# Patient Record
Sex: Female | Born: 1935 | Race: White | Hispanic: No | Smoking: Never smoker
Health system: Southern US, Community
[De-identification: ages and names within clinical notes are randomized; demographics above are authoritative.]

## PROBLEM LIST (undated history)

## (undated) DIAGNOSIS — E785 Hyperlipidemia, unspecified: Secondary | ICD-10-CM

## (undated) DIAGNOSIS — H409 Unspecified glaucoma: Secondary | ICD-10-CM

## (undated) DIAGNOSIS — R1310 Dysphagia, unspecified: Secondary | ICD-10-CM

## (undated) DIAGNOSIS — M6281 Muscle weakness (generalized): Secondary | ICD-10-CM

## (undated) DIAGNOSIS — G40909 Epilepsy, unspecified, not intractable, without status epilepticus: Secondary | ICD-10-CM

## (undated) DIAGNOSIS — I2510 Atherosclerotic heart disease of native coronary artery without angina pectoris: Secondary | ICD-10-CM

## (undated) DIAGNOSIS — F0390 Unspecified dementia without behavioral disturbance: Secondary | ICD-10-CM

## (undated) DIAGNOSIS — F419 Anxiety disorder, unspecified: Secondary | ICD-10-CM

## (undated) DIAGNOSIS — K5792 Diverticulitis of intestine, part unspecified, without perforation or abscess without bleeding: Secondary | ICD-10-CM

## (undated) DIAGNOSIS — I10 Essential (primary) hypertension: Secondary | ICD-10-CM

## (undated) DIAGNOSIS — E039 Hypothyroidism, unspecified: Secondary | ICD-10-CM

## (undated) DIAGNOSIS — I509 Heart failure, unspecified: Secondary | ICD-10-CM

## (undated) HISTORY — PX: ABDOMINAL HYSTERECTOMY: SHX81

---

## 2004-08-25 ENCOUNTER — Emergency Department: Payer: Self-pay | Admitting: Emergency Medicine

## 2005-01-28 ENCOUNTER — Ambulatory Visit: Payer: Self-pay | Admitting: Internal Medicine

## 2006-01-11 ENCOUNTER — Ambulatory Visit: Payer: Self-pay | Admitting: Internal Medicine

## 2006-01-31 ENCOUNTER — Ambulatory Visit: Payer: Self-pay | Admitting: Internal Medicine

## 2006-05-15 ENCOUNTER — Other Ambulatory Visit: Payer: Self-pay

## 2006-05-15 ENCOUNTER — Emergency Department: Payer: Self-pay | Admitting: Emergency Medicine

## 2006-06-13 IMAGING — CT CT HEAD WITHOUT AND WITH CONTRAST
1 of 2 series · 13 of 30 positions shown, 17 images · non-contrast
Comparison: none

REASON FOR EXAM: ataxia TIA
COMMENTS:

[Series 2: without · axial · non-contrast · 0.41mm/px · z∈[+1212,+1342]mm · 13 of 32 slices shown, 17 images]
[im 3/32  brain]
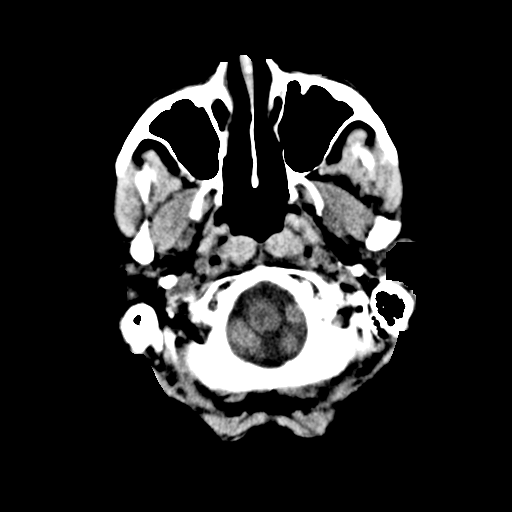
[im 3/32  bone]
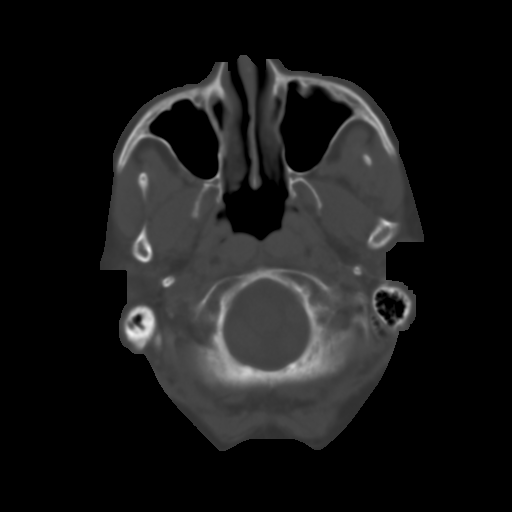
[im 5/32  brain]
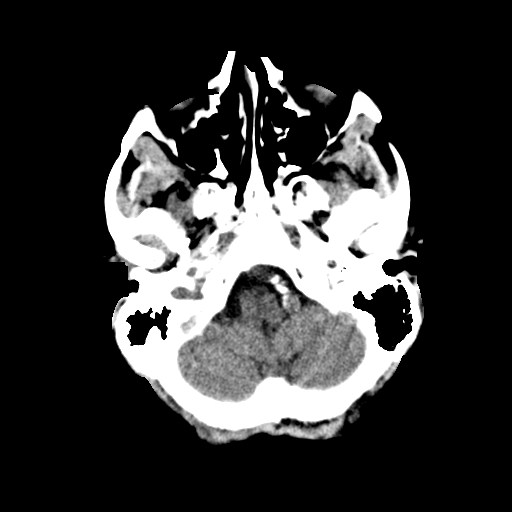
[im 7/32  brain]
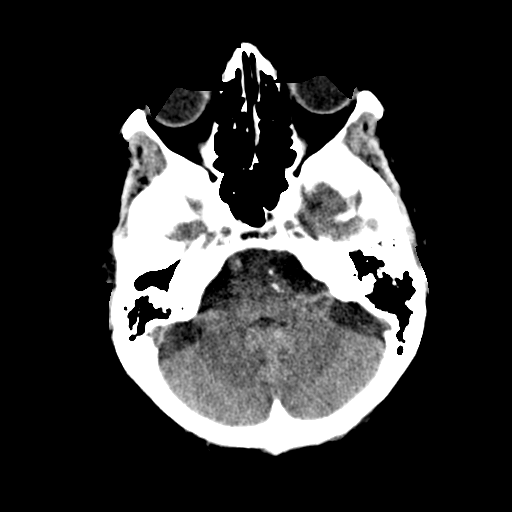
[im 9/32  brain]
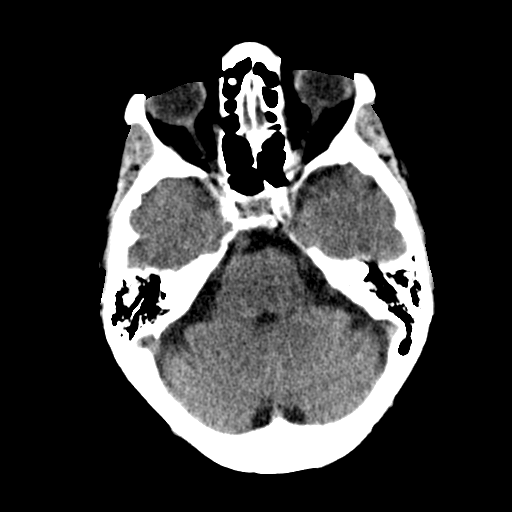
[im 12/32  brain]
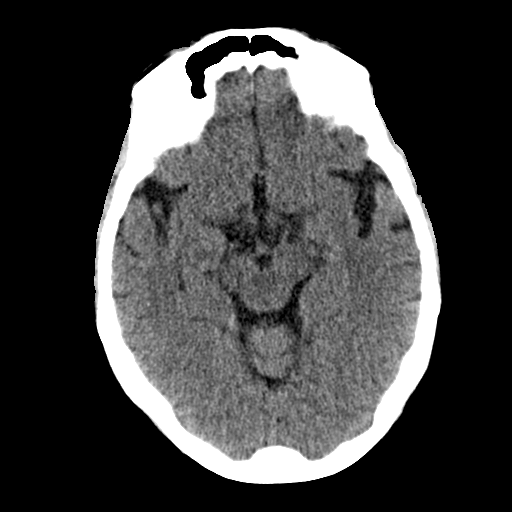
[im 12/32  bone]
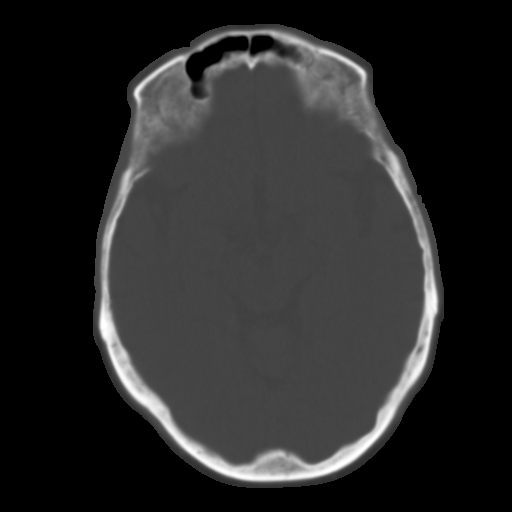
[im 14/32  brain]
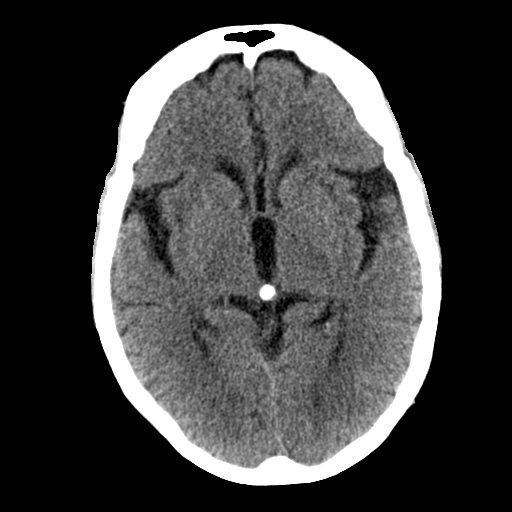
[im 16/32  brain]
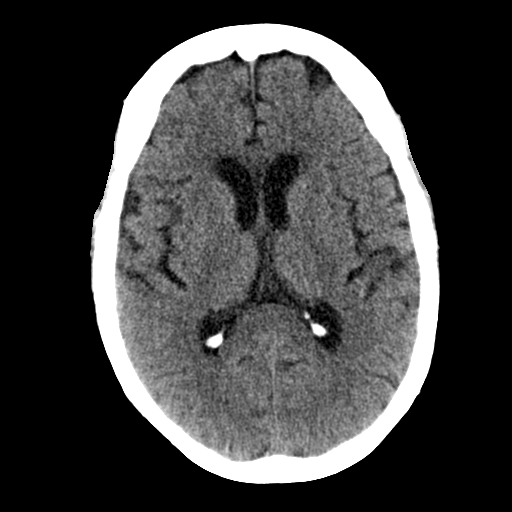
[im 18/32  brain]
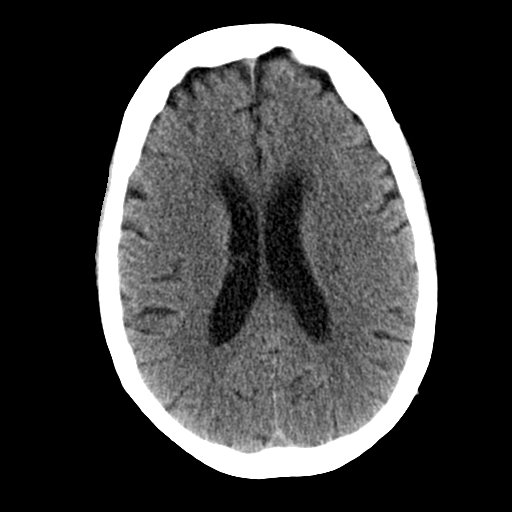
[im 20/32  brain]
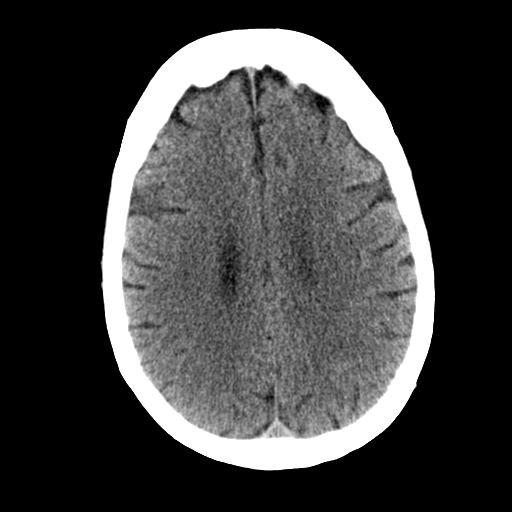
[im 20/32  bone]
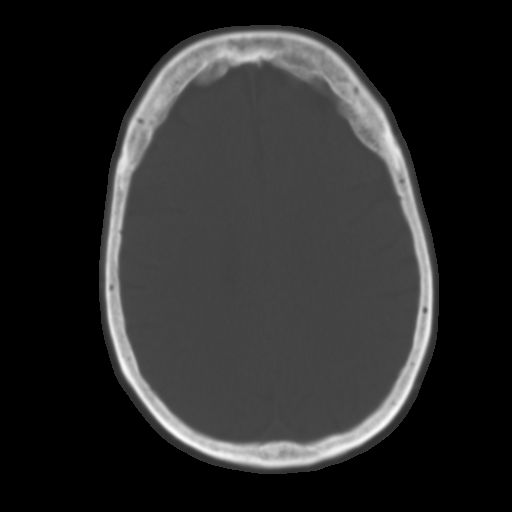
[im 23/32  brain]
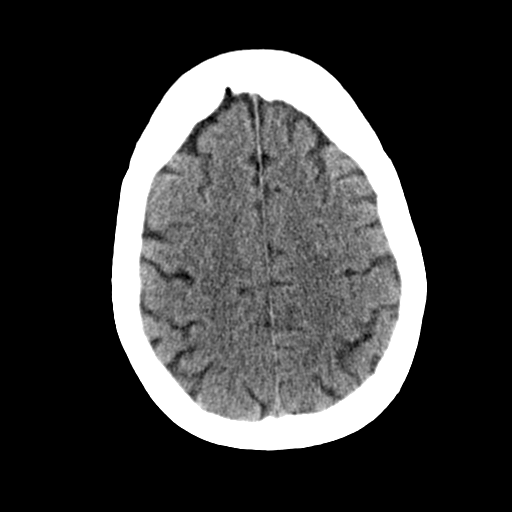
[im 25/32  brain]
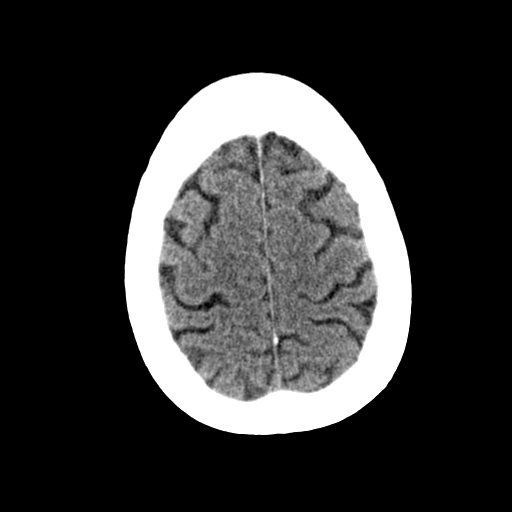
[im 27/32  brain]
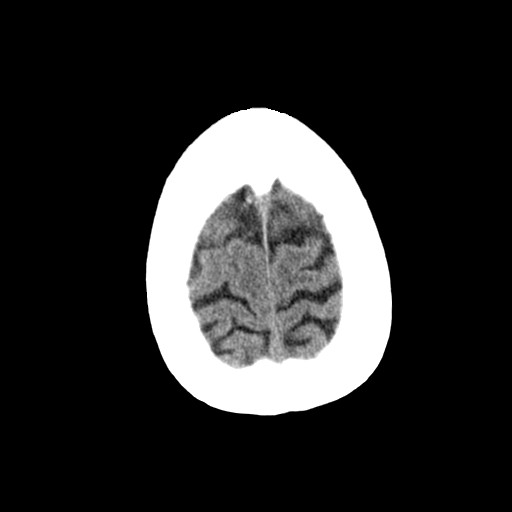
[im 29/32  brain]
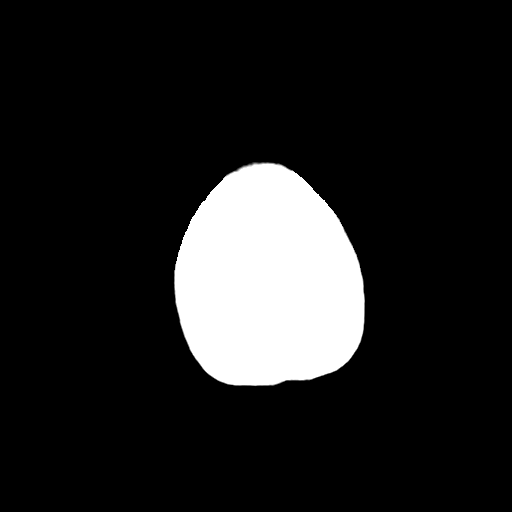
[im 29/32  bone]
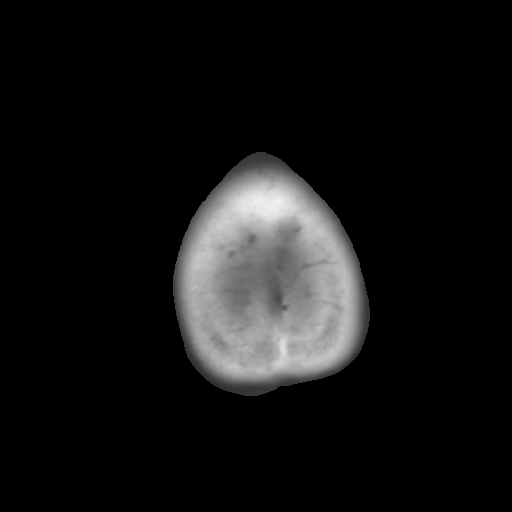

[13 of 30 positions shown; findings below may reference images not displayed]

PROCEDURE:     CT  - CT HEAD W/WO  - January 11, 2006  [DATE]

RESULT:     The patient is experiencing TIA symptoms.

The non-contrast images reveal mild age appropriate diffuse cerebral and
cerebellar atrophy. This is unchanged from a study 25 August, 2004.
There is no evidence of an intracranial hemorrhage or mass or hydrocephalus.
 Following intravenous administration of 40 ml of Isovue 370 the enhancement
pattern of the brain parenchyma is normal. The visualized portions of the
intracranial vasculature also exhibit no acute abnormality.  No enhancing
masses are seen.  At bone window settings I see no lytic or blastic lesions.
 There is no evidence of a skull fracture.  There is hyperostosis frontalis
interna.  The observed portions of the paranasal sinuses exhibit no acute
abnormality.
IMPRESSION: 1)I see no acute intracranial abnormality.  Specific attention to the
cerebellum and brainstem exhibit no acute findings.  Should the patient's
symptoms persist and remain unexplained, further evaluation with MRI may be
of value.

## 2006-07-13 ENCOUNTER — Ambulatory Visit: Payer: Self-pay | Admitting: Internal Medicine

## 2006-07-13 ENCOUNTER — Emergency Department: Payer: Self-pay | Admitting: Emergency Medicine

## 2006-07-14 ENCOUNTER — Ambulatory Visit: Payer: Self-pay | Admitting: Orthopaedic Surgery

## 2006-07-16 ENCOUNTER — Emergency Department: Payer: Self-pay | Admitting: Emergency Medicine

## 2006-07-18 ENCOUNTER — Ambulatory Visit: Payer: Self-pay | Admitting: Orthopaedic Surgery

## 2006-08-22 ENCOUNTER — Ambulatory Visit: Payer: Self-pay | Admitting: Internal Medicine

## 2007-01-23 IMAGING — NM NM THYROID IMAGING W/ UPTAKE SINGLE (24 HR)
1 series · 1 of 1 positions shown · non-contrast
Comparison: none

REASON FOR EXAM: Thyroid nodule
COMMENTS:

[Series 1: (id) thyroid scan · 2.40mm/px · 1 of 1 slices shown]
[im 1/1  full-range]
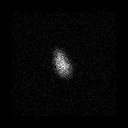

[1 of 1 positions shown; findings below may reference images not displayed]

PROCEDURE:     NM  - NM THYROID V-PDM 24 HR [DATE]  [DATE]

RESULT:       Following intravenous administration of 133.5 mCi radioactive
Iodine V-PDM, the 5-hour uptake measured 3.5% and the 24-hour uptake
measured 8.1%.  These values are in the hypothyroid range.

Thyroid scan was performed in the anterior view.  The LEFT lobe is not
visualized and apparently has been surgically resected.  There is noted
tracer activity on the RIGHT which apparently corresponds to the
hypovascular nodule noted on prior CT of 07/13/06.  This nodule, therefore,
appears to represent functioning thyroid tissue.  Within the functioning
area, no cold regions are identified.
IMPRESSION: 1.     Thyroid uptake values are in the hypothyroid range.
2.     Functioning thyroid tissue is observed on the RIGHT with no cold
areas seen.  The tracer activity on the RIGHT apparently corresponds to the
hypoechoic nodular appearing region on prior ultrasound and which is shown
by this exam to represent functioning thyroid tissue.

## 2007-04-28 ENCOUNTER — Ambulatory Visit: Payer: Self-pay | Admitting: Internal Medicine

## 2007-08-17 HISTORY — PX: OTHER SURGICAL HISTORY: SHX169

## 2007-09-15 ENCOUNTER — Ambulatory Visit: Payer: Self-pay | Admitting: Internal Medicine

## 2007-10-23 ENCOUNTER — Ambulatory Visit: Payer: Self-pay

## 2007-10-27 ENCOUNTER — Ambulatory Visit: Payer: Self-pay

## 2008-02-19 ENCOUNTER — Ambulatory Visit: Payer: Self-pay | Admitting: Orthopedic Surgery

## 2008-02-23 ENCOUNTER — Ambulatory Visit: Payer: Self-pay | Admitting: Orthopedic Surgery

## 2008-03-01 ENCOUNTER — Inpatient Hospital Stay: Payer: Self-pay | Admitting: Orthopedic Surgery

## 2008-03-12 ENCOUNTER — Encounter: Payer: Self-pay | Admitting: Internal Medicine

## 2008-03-16 ENCOUNTER — Encounter: Payer: Self-pay | Admitting: Internal Medicine

## 2008-05-20 ENCOUNTER — Ambulatory Visit: Payer: Self-pay | Admitting: Internal Medicine

## 2008-05-22 ENCOUNTER — Ambulatory Visit: Payer: Self-pay | Admitting: Internal Medicine

## 2008-11-12 ENCOUNTER — Ambulatory Visit: Payer: Self-pay | Admitting: Orthopedic Surgery

## 2008-11-26 ENCOUNTER — Ambulatory Visit: Payer: Self-pay | Admitting: Orthopedic Surgery

## 2009-03-06 ENCOUNTER — Emergency Department: Payer: Self-pay | Admitting: Emergency Medicine

## 2010-01-08 ENCOUNTER — Ambulatory Visit: Payer: Self-pay | Admitting: Internal Medicine

## 2010-05-12 ENCOUNTER — Inpatient Hospital Stay: Payer: Self-pay | Admitting: Internal Medicine

## 2010-05-15 ENCOUNTER — Encounter: Payer: Self-pay | Admitting: Internal Medicine

## 2010-05-16 ENCOUNTER — Encounter: Payer: Self-pay | Admitting: Internal Medicine

## 2010-06-16 ENCOUNTER — Encounter: Payer: Self-pay | Admitting: Internal Medicine

## 2010-08-25 ENCOUNTER — Other Ambulatory Visit: Payer: Self-pay | Admitting: Ophthalmology

## 2010-12-16 ENCOUNTER — Emergency Department: Payer: Self-pay | Admitting: Emergency Medicine

## 2011-04-13 ENCOUNTER — Inpatient Hospital Stay: Payer: Self-pay | Admitting: Surgery

## 2011-04-15 ENCOUNTER — Encounter: Payer: Self-pay | Admitting: Internal Medicine

## 2011-04-17 ENCOUNTER — Encounter: Payer: Self-pay | Admitting: Internal Medicine

## 2011-05-17 ENCOUNTER — Encounter: Payer: Self-pay | Admitting: Internal Medicine

## 2011-11-02 ENCOUNTER — Emergency Department: Payer: Self-pay | Admitting: Internal Medicine

## 2011-11-02 LAB — CBC
HGB: 13.9 g/dL (ref 12.0–16.0)
MCH: 30.8 pg (ref 26.0–34.0)
MCHC: 32.5 g/dL (ref 32.0–36.0)
MCV: 95 fL (ref 80–100)
Platelet: 226 10*3/uL (ref 150–440)
RBC: 4.5 10*6/uL (ref 3.80–5.20)

## 2011-11-02 LAB — COMPREHENSIVE METABOLIC PANEL
Albumin: 4.2 g/dL (ref 3.4–5.0)
Anion Gap: 6 — ABNORMAL LOW (ref 7–16)
BUN: 13 mg/dL (ref 7–18)
Calcium, Total: 9.3 mg/dL (ref 8.5–10.1)
Co2: 27 mmol/L (ref 21–32)
Creatinine: 0.8 mg/dL (ref 0.60–1.30)
Glucose: 112 mg/dL — ABNORMAL HIGH (ref 65–99)
Osmolality: 275 (ref 275–301)
Potassium: 4 mmol/L (ref 3.5–5.1)
SGOT(AST): 32 U/L (ref 15–37)
Sodium: 137 mmol/L (ref 136–145)
Total Protein: 8.3 g/dL — ABNORMAL HIGH (ref 6.4–8.2)

## 2011-11-02 LAB — TROPONIN I: Troponin-I: <0.02 ng/mL

## 2011-11-02 LAB — CK TOTAL AND CKMB (NOT AT ARMC): CK-MB: 3 ng/mL (ref 0.5–3.6)

## 2012-05-27 ENCOUNTER — Emergency Department: Payer: Self-pay | Admitting: Emergency Medicine

## 2012-05-27 LAB — COMPREHENSIVE METABOLIC PANEL
Albumin: 3.6 g/dL (ref 3.4–5.0)
Anion Gap: 9 (ref 7–16)
BUN: 17 mg/dL (ref 7–18)
Bilirubin,Total: 0.8 mg/dL (ref 0.2–1.0)
Calcium, Total: 9.1 mg/dL (ref 8.5–10.1)
Chloride: 102 mmol/L (ref 98–107)
EGFR (African American): 52 — ABNORMAL LOW
Glucose: 147 mg/dL — ABNORMAL HIGH (ref 65–99)
Potassium: 3.9 mmol/L (ref 3.5–5.1)
SGOT(AST): 15 U/L (ref 15–37)
SGPT (ALT): 16 U/L (ref 12–78)
Total Protein: 8 g/dL (ref 6.4–8.2)

## 2012-05-27 LAB — CBC WITH DIFFERENTIAL/PLATELET
Basophil #: 0 10*3/uL (ref 0.0–0.1)
Basophil %: 0.2 %
Eosinophil %: 0.6 %
HCT: 37.8 % (ref 35.0–47.0)
HGB: 13.1 g/dL (ref 12.0–16.0)
Lymphocyte #: 0.9 10*3/uL — ABNORMAL LOW (ref 1.0–3.6)
MCH: 32.2 pg (ref 26.0–34.0)
MCHC: 34.7 g/dL (ref 32.0–36.0)
MCV: 93 fL (ref 80–100)
Monocyte #: 0.9 x10 3/mm (ref 0.2–0.9)
Neutrophil %: 79.5 %
RBC: 4.06 10*6/uL (ref 3.80–5.20)
RDW: 13.8 % (ref 11.5–14.5)

## 2012-05-27 LAB — CK TOTAL AND CKMB (NOT AT ARMC): CK, Total: 54 U/L (ref 21–215)

## 2012-05-27 LAB — URINALYSIS, COMPLETE
Bilirubin,UR: NEGATIVE
Blood: NEGATIVE
Glucose,UR: NEGATIVE mg/dL (ref 0–75)
Hyaline Cast: 3
Leukocyte Esterase: NEGATIVE
Ph: 6 (ref 4.5–8.0)
Protein: NEGATIVE
Specific Gravity: 1.014 (ref 1.003–1.030)
Squamous Epithelial: <1

## 2012-05-27 LAB — TROPONIN I: Troponin-I: <0.02 ng/mL

## 2012-07-14 ENCOUNTER — Emergency Department: Payer: Self-pay | Admitting: Emergency Medicine

## 2012-12-09 ENCOUNTER — Emergency Department: Payer: Self-pay | Admitting: Emergency Medicine

## 2012-12-09 LAB — COMPREHENSIVE METABOLIC PANEL
Calcium, Total: 9.1 mg/dL (ref 8.5–10.1)
Chloride: 101 mmol/L (ref 98–107)
Co2: 28 mmol/L (ref 21–32)
Creatinine: 0.78 mg/dL (ref 0.60–1.30)
EGFR (Non-African Amer.): >60
SGPT (ALT): 22 U/L (ref 12–78)
Sodium: 135 mmol/L — ABNORMAL LOW (ref 136–145)
Total Protein: 8.9 g/dL — ABNORMAL HIGH (ref 6.4–8.2)

## 2012-12-09 LAB — CBC
HCT: 44.1 % (ref 35.0–47.0)
HGB: 14.9 g/dL (ref 12.0–16.0)
MCH: 31 pg (ref 26.0–34.0)
MCV: 92 fL (ref 80–100)
Platelet: 227 10*3/uL (ref 150–440)
WBC: 7.7 10*3/uL (ref 3.6–11.0)

## 2012-12-09 LAB — TROPONIN I: Troponin-I: <0.02 ng/mL

## 2012-12-10 LAB — URINALYSIS, COMPLETE
Bacteria: NONE SEEN
Bilirubin,UR: NEGATIVE
Leukocyte Esterase: NEGATIVE
Ph: 8 (ref 4.5–8.0)
Specific Gravity: 1.006 (ref 1.003–1.030)
Squamous Epithelial: NONE SEEN
WBC UR: <1 /HPF (ref 0–5)

## 2012-12-10 LAB — LIPASE, BLOOD: Lipase: 106 U/L (ref 73–393)

## 2013-01-06 LAB — COMPREHENSIVE METABOLIC PANEL
Albumin: 4 g/dL (ref 3.4–5.0)
Alkaline Phosphatase: 111 U/L (ref 50–136)
Anion Gap: 10 (ref 7–16)
BUN: 21 mg/dL — ABNORMAL HIGH (ref 7–18)
Bilirubin,Total: 1.2 mg/dL — ABNORMAL HIGH (ref 0.2–1.0)
Calcium, Total: 9.9 mg/dL (ref 8.5–10.1)
Chloride: 102 mmol/L (ref 98–107)
Co2: 23 mmol/L (ref 21–32)
EGFR (Non-African Amer.): 41 — ABNORMAL LOW
Glucose: 172 mg/dL — ABNORMAL HIGH (ref 65–99)
Osmolality: 277 (ref 275–301)
Potassium: 3.7 mmol/L (ref 3.5–5.1)
SGPT (ALT): 24 U/L (ref 12–78)
Sodium: 135 mmol/L — ABNORMAL LOW (ref 136–145)

## 2013-01-06 LAB — CBC
HGB: 15.7 g/dL (ref 12.0–16.0)
MCH: 31.1 pg (ref 26.0–34.0)
MCV: 90 fL (ref 80–100)
Platelet: 290 10*3/uL (ref 150–440)
RBC: 5.05 10*6/uL (ref 3.80–5.20)

## 2013-01-06 LAB — URINALYSIS, COMPLETE
Glucose,UR: NEGATIVE mg/dL (ref 0–75)
Granular Cast: 39
Hyaline Cast: 36
Ketone: NEGATIVE
Leukocyte Esterase: NEGATIVE
Nitrite: NEGATIVE
Ph: 5 (ref 4.5–8.0)
RBC,UR: 3 /HPF (ref 0–5)
Specific Gravity: 1.016 (ref 1.003–1.030)
Squamous Epithelial: 1
WBC UR: 1 /HPF (ref 0–5)

## 2013-01-06 LAB — TSH: Thyroid Stimulating Horm: 0.995 u[IU]/mL

## 2013-01-07 ENCOUNTER — Inpatient Hospital Stay: Payer: Self-pay | Admitting: Internal Medicine

## 2013-01-07 LAB — CBC WITH DIFFERENTIAL/PLATELET
Eosinophil %: 0 %
Lymphocyte #: 1.1 10*3/uL (ref 1.0–3.6)
Lymphocyte %: 4.2 %
MCV: 91 fL (ref 80–100)
Monocyte #: 1.8 x10 3/mm — ABNORMAL HIGH (ref 0.2–0.9)
Platelet: 306 10*3/uL (ref 150–440)
RBC: 4.95 10*6/uL (ref 3.80–5.20)
RDW: 14.1 % (ref 11.5–14.5)

## 2013-01-07 LAB — COMPREHENSIVE METABOLIC PANEL
Alkaline Phosphatase: 89 U/L (ref 50–136)
Bilirubin,Total: 1 mg/dL (ref 0.2–1.0)
Calcium, Total: 9.2 mg/dL (ref 8.5–10.1)
Co2: 24 mmol/L (ref 21–32)
Creatinine: 0.97 mg/dL (ref 0.60–1.30)
EGFR (African American): 59 — ABNORMAL LOW
Glucose: 140 mg/dL — ABNORMAL HIGH (ref 65–99)
Osmolality: 278 (ref 275–301)
Potassium: 4.4 mmol/L (ref 3.5–5.1)
SGOT(AST): 27 U/L (ref 15–37)
SGPT (ALT): 19 U/L (ref 12–78)
Sodium: 136 mmol/L (ref 136–145)
Total Protein: 7.5 g/dL (ref 6.4–8.2)

## 2013-01-08 LAB — CBC WITH DIFFERENTIAL/PLATELET
Basophil #: 0 10*3/uL (ref 0.0–0.1)
Basophil %: 0.2 %
Eosinophil %: 0.2 %
HCT: 39.7 % (ref 35.0–47.0)
Lymphocyte #: 1.6 10*3/uL (ref 1.0–3.6)
Lymphocyte %: 9.1 %
Monocyte #: 1.7 x10 3/mm — ABNORMAL HIGH (ref 0.2–0.9)
Monocyte %: 9.5 %
Neutrophil #: 14.2 10*3/uL — ABNORMAL HIGH (ref 1.4–6.5)
Neutrophil %: 81 %
Platelet: 241 10*3/uL (ref 150–440)
RBC: 4.38 10*6/uL (ref 3.80–5.20)
WBC: 17.6 10*3/uL — ABNORMAL HIGH (ref 3.6–11.0)

## 2013-01-08 LAB — URINE CULTURE

## 2013-01-08 LAB — BASIC METABOLIC PANEL
BUN: 15 mg/dL (ref 7–18)
Calcium, Total: 8.7 mg/dL (ref 8.5–10.1)
Co2: 26 mmol/L (ref 21–32)
Creatinine: 0.81 mg/dL (ref 0.60–1.30)
EGFR (African American): >60
Glucose: 104 mg/dL — ABNORMAL HIGH (ref 65–99)
Potassium: 3.9 mmol/L (ref 3.5–5.1)
Sodium: 136 mmol/L (ref 136–145)

## 2013-01-08 LAB — TSH: Thyroid Stimulating Horm: 0.968 u[IU]/mL

## 2013-01-09 LAB — CBC WITH DIFFERENTIAL/PLATELET
Basophil %: 0.4 %
Eosinophil %: 1 %
HCT: 35.2 % (ref 35.0–47.0)
MCHC: 34.4 g/dL (ref 32.0–36.0)
MCV: 90 fL (ref 80–100)
Monocyte #: 0.9 x10 3/mm (ref 0.2–0.9)
Monocyte %: 7.5 %
Neutrophil #: 9.6 10*3/uL — ABNORMAL HIGH (ref 1.4–6.5)
RBC: 3.91 10*6/uL (ref 3.80–5.20)
RDW: 14.6 % — ABNORMAL HIGH (ref 11.5–14.5)
WBC: 12.3 10*3/uL — ABNORMAL HIGH (ref 3.6–11.0)

## 2013-01-09 LAB — BASIC METABOLIC PANEL
Anion Gap: 7 (ref 7–16)
Calcium, Total: 8.2 mg/dL — ABNORMAL LOW (ref 8.5–10.1)
Chloride: 110 mmol/L — ABNORMAL HIGH (ref 98–107)
Co2: 22 mmol/L (ref 21–32)
EGFR (African American): >60
EGFR (Non-African Amer.): 59 — ABNORMAL LOW
Glucose: 97 mg/dL (ref 65–99)
Osmolality: 277 (ref 275–301)

## 2013-01-10 LAB — CBC WITH DIFFERENTIAL/PLATELET
Eosinophil #: 0.2 10*3/uL (ref 0.0–0.7)
Eosinophil %: 2.5 %
HCT: 34.4 % — ABNORMAL LOW (ref 35.0–47.0)
HGB: 12.1 g/dL (ref 12.0–16.0)
Lymphocyte #: 1.6 10*3/uL (ref 1.0–3.6)
Lymphocyte %: 17.5 %
MCH: 32 pg (ref 26.0–34.0)
MCHC: 35.2 g/dL (ref 32.0–36.0)
MCV: 91 fL (ref 80–100)
Monocyte %: 8.9 %
Neutrophil #: 6.3 10*3/uL (ref 1.4–6.5)
Platelet: 280 10*3/uL (ref 150–440)
RBC: 3.79 10*6/uL — ABNORMAL LOW (ref 3.80–5.20)
RDW: 14.7 % — ABNORMAL HIGH (ref 11.5–14.5)

## 2013-01-10 LAB — BASIC METABOLIC PANEL
Anion Gap: 6 — ABNORMAL LOW (ref 7–16)
BUN: 7 mg/dL (ref 7–18)
Calcium, Total: 8.2 mg/dL — ABNORMAL LOW (ref 8.5–10.1)
Co2: 22 mmol/L (ref 21–32)
Creatinine: 0.75 mg/dL (ref 0.60–1.30)
EGFR (African American): >60
Glucose: 88 mg/dL (ref 65–99)
Osmolality: 279 (ref 275–301)
Potassium: 4 mmol/L (ref 3.5–5.1)

## 2013-01-11 ENCOUNTER — Encounter: Payer: Self-pay | Admitting: Internal Medicine

## 2013-01-12 LAB — CULTURE, BLOOD (SINGLE)

## 2013-01-17 ENCOUNTER — Ambulatory Visit: Payer: Self-pay | Admitting: Internal Medicine

## 2013-01-18 ENCOUNTER — Encounter: Payer: Self-pay | Admitting: Internal Medicine

## 2013-02-06 LAB — URINALYSIS, COMPLETE
Bilirubin,UR: NEGATIVE
Hyaline Cast: 4
Nitrite: NEGATIVE
Ph: 6 (ref 4.5–8.0)
Protein: NEGATIVE
Squamous Epithelial: <1
WBC UR: 139 /HPF (ref 0–5)

## 2013-02-08 LAB — URINE CULTURE

## 2013-02-13 ENCOUNTER — Encounter: Payer: Self-pay | Admitting: Internal Medicine

## 2013-08-08 ENCOUNTER — Inpatient Hospital Stay (HOSPITAL_COMMUNITY)
Admission: EM | Admit: 2013-08-08 | Discharge: 2013-08-13 | DRG: 536 | Disposition: A | Discharge status: Skilled Nursing Facility | Payer: Medicare Other | Attending: Internal Medicine | Admitting: Internal Medicine

## 2013-08-08 ENCOUNTER — Encounter (HOSPITAL_COMMUNITY): Payer: Self-pay | Admitting: Emergency Medicine

## 2013-08-08 DIAGNOSIS — F0390 Unspecified dementia without behavioral disturbance: Secondary | ICD-10-CM | POA: Diagnosis present

## 2013-08-08 DIAGNOSIS — I10 Essential (primary) hypertension: Secondary | ICD-10-CM

## 2013-08-08 DIAGNOSIS — E039 Hypothyroidism, unspecified: Secondary | ICD-10-CM | POA: Diagnosis present

## 2013-08-08 DIAGNOSIS — W010XXA Fall on same level from slipping, tripping and stumbling without subsequent striking against object, initial encounter: Secondary | ICD-10-CM | POA: Diagnosis present

## 2013-08-08 DIAGNOSIS — Y9210 Unspecified residential institution as the place of occurrence of the external cause: Secondary | ICD-10-CM | POA: Diagnosis present

## 2013-08-08 DIAGNOSIS — S72009A Fracture of unspecified part of neck of unspecified femur, initial encounter for closed fracture: Principal | ICD-10-CM

## 2013-08-08 DIAGNOSIS — Z23 Encounter for immunization: Secondary | ICD-10-CM

## 2013-08-08 DIAGNOSIS — Z96649 Presence of unspecified artificial hip joint: Secondary | ICD-10-CM

## 2013-08-08 DIAGNOSIS — H409 Unspecified glaucoma: Secondary | ICD-10-CM | POA: Diagnosis present

## 2013-08-08 DIAGNOSIS — E871 Hypo-osmolality and hyponatremia: Secondary | ICD-10-CM | POA: Diagnosis present

## 2013-08-08 DIAGNOSIS — F411 Generalized anxiety disorder: Secondary | ICD-10-CM | POA: Diagnosis present

## 2013-08-08 DIAGNOSIS — G40909 Epilepsy, unspecified, not intractable, without status epilepticus: Secondary | ICD-10-CM | POA: Diagnosis present

## 2013-08-08 DIAGNOSIS — Z79899 Other long term (current) drug therapy: Secondary | ICD-10-CM

## 2013-08-08 DIAGNOSIS — Z8719 Personal history of other diseases of the digestive system: Secondary | ICD-10-CM

## 2013-08-08 DIAGNOSIS — Z880 Allergy status to penicillin: Secondary | ICD-10-CM

## 2013-08-08 DIAGNOSIS — E785 Hyperlipidemia, unspecified: Secondary | ICD-10-CM | POA: Diagnosis present

## 2013-08-08 DIAGNOSIS — S72001A Fracture of unspecified part of neck of right femur, initial encounter for closed fracture: Secondary | ICD-10-CM

## 2013-08-08 DIAGNOSIS — E876 Hypokalemia: Secondary | ICD-10-CM | POA: Diagnosis present

## 2013-08-08 HISTORY — DX: Hypothyroidism, unspecified: E03.9

## 2013-08-08 HISTORY — DX: Unspecified dementia, unspecified severity, without behavioral disturbance, psychotic disturbance, mood disturbance, and anxiety: F03.90

## 2013-08-08 HISTORY — DX: Essential (primary) hypertension: I10

## 2013-08-08 HISTORY — DX: Unspecified glaucoma: H40.9

## 2013-08-08 HISTORY — DX: Muscle weakness (generalized): M62.81

## 2013-08-08 HISTORY — DX: Epilepsy, unspecified, not intractable, without status epilepticus: G40.909

## 2013-08-08 HISTORY — DX: Anxiety disorder, unspecified: F41.9

## 2013-08-08 HISTORY — DX: Hyperlipidemia, unspecified: E78.5

## 2013-08-08 HISTORY — DX: Dysphagia, unspecified: R13.10

## 2013-08-08 HISTORY — DX: Diverticulitis of intestine, part unspecified, without perforation or abscess without bleeding: K57.92

## 2013-08-08 NOTE — ED Notes (Signed)
Patient loss balance and fell on right side tonight. Right hip pain 7/10, no deformity, no leg shortening, pain on ROM, no bruising, no LOC. Pt alert, oriented. VSS. No blood thinner.

## 2013-08-08 NOTE — ED Notes (Signed)
Bed: ZO10 Expected date:  Expected time:  Means of arrival:  Comments: EMS/79 yo female with fall-pain

## 2013-08-09 ENCOUNTER — Encounter (HOSPITAL_COMMUNITY): Payer: Self-pay | Admitting: Emergency Medicine

## 2013-08-09 ENCOUNTER — Emergency Department (HOSPITAL_COMMUNITY): Payer: Medicare Other

## 2013-08-09 DIAGNOSIS — S72009A Fracture of unspecified part of neck of unspecified femur, initial encounter for closed fracture: Secondary | ICD-10-CM

## 2013-08-09 LAB — CBC WITH DIFFERENTIAL/PLATELET
Basophils Absolute: 0 10*3/uL (ref 0.0–0.1)
Basophils Relative: 0 % (ref 0–1)
Eosinophils Relative: 1 % (ref 0–5)
HCT: 40.8 % (ref 36.0–46.0)
Hemoglobin: 13.9 g/dL (ref 12.0–15.0)
Lymphocytes Relative: 11 % — ABNORMAL LOW (ref 12–46)
MCH: 30.9 pg (ref 26.0–34.0)
MCHC: 34.1 g/dL (ref 30.0–36.0)
MCV: 90.7 fL (ref 78.0–100.0)
Monocytes Absolute: 1 10*3/uL (ref 0.1–1.0)
Monocytes Relative: 8 % (ref 3–12)
RBC: 4.5 MIL/uL (ref 3.87–5.11)
RDW: 13.7 % (ref 11.5–15.5)

## 2013-08-09 LAB — BASIC METABOLIC PANEL
BUN: 14 mg/dL (ref 6–23)
Calcium: 9.3 mg/dL (ref 8.4–10.5)
Chloride: 99 mEq/L (ref 96–112)
Creatinine, Ser: 1 mg/dL (ref 0.50–1.10)
GFR calc Af Amer: 55 mL/min — ABNORMAL LOW (ref >90)
Glucose, Bld: 152 mg/dL — ABNORMAL HIGH (ref 70–99)

## 2013-08-09 LAB — PROTIME-INR: INR: 1.03 (ref 0.00–1.49)

## 2013-08-09 LAB — ABO/RH: ABO/RH(D): A POS

## 2013-08-09 MED ORDER — ASPIRIN EC 81 MG PO TBEC
81.0000 mg | DELAYED_RELEASE_TABLET | Freq: Every day | ORAL | Status: DC
  Administered 2013-08-09 – 2013-08-13 (×5): 81 mg via ORAL
  Filled 2013-08-09 (×5): qty 1

## 2013-08-09 MED ORDER — METOPROLOL TARTRATE 1 MG/ML IV SOLN
5.0000 mg | INTRAVENOUS | Status: DC | PRN
  Filled 2013-08-09: qty 5

## 2013-08-09 MED ORDER — AMITRIPTYLINE HCL 25 MG PO TABS
25.0000 mg | ORAL_TABLET | Freq: Every day | ORAL | Status: DC
  Administered 2013-08-09 – 2013-08-12 (×4): 25 mg via ORAL
  Filled 2013-08-09 (×5): qty 1

## 2013-08-09 MED ORDER — FUROSEMIDE 20 MG PO TABS
20.0000 mg | ORAL_TABLET | Freq: Every day | ORAL | Status: DC
  Administered 2013-08-09 – 2013-08-13 (×5): 20 mg via ORAL
  Filled 2013-08-09 (×5): qty 1

## 2013-08-09 MED ORDER — ONDANSETRON HCL 4 MG/2ML IJ SOLN: 4.0000 mg | Freq: Four times a day (QID) | INTRAMUSCULAR | Status: DC | PRN

## 2013-08-09 MED ORDER — DONEPEZIL HCL 10 MG PO TABS
10.0000 mg | ORAL_TABLET | Freq: Every day | ORAL | Status: DC
  Administered 2013-08-09 – 2013-08-12 (×4): 10 mg via ORAL
  Filled 2013-08-09 (×5): qty 1

## 2013-08-09 MED ORDER — SODIUM CHLORIDE 0.9 % IV SOLN
INTRAVENOUS | Status: AC
  Administered 2013-08-09: 50 mL/h via INTRAVENOUS

## 2013-08-09 MED ORDER — POTASSIUM CHLORIDE 20 MEQ/15ML (10%) PO LIQD
10.0000 meq | Freq: Every day | ORAL | Status: DC
  Administered 2013-08-09: 10 meq via ORAL
  Filled 2013-08-09 (×2): qty 7.5

## 2013-08-09 MED ORDER — SENNOSIDES-DOCUSATE SODIUM 8.6-50 MG PO TABS
1.0000 | ORAL_TABLET | Freq: Two times a day (BID) | ORAL | Status: DC
  Administered 2013-08-09 – 2013-08-13 (×8): 1 via ORAL
  Filled 2013-08-09 (×10): qty 1

## 2013-08-09 MED ORDER — AMLODIPINE BESYLATE 5 MG PO TABS
5.0000 mg | ORAL_TABLET | Freq: Every day | ORAL | Status: DC
  Administered 2013-08-09 – 2013-08-13 (×5): 5 mg via ORAL
  Filled 2013-08-09 (×5): qty 1

## 2013-08-09 MED ORDER — HEPARIN SODIUM (PORCINE) 5000 UNIT/ML IJ SOLN
5000.0000 [IU] | Freq: Three times a day (TID) | INTRAMUSCULAR | Status: DC
  Administered 2013-08-09: 5000 [IU] via SUBCUTANEOUS
  Filled 2013-08-09 (×4): qty 1

## 2013-08-09 MED ORDER — SODIUM CHLORIDE 0.9 % IV SOLN
INTRAVENOUS | Status: DC
  Administered 2013-08-09: 07:00:00 via INTRAVENOUS

## 2013-08-09 MED ORDER — PANTOPRAZOLE SODIUM 40 MG PO TBEC
40.0000 mg | DELAYED_RELEASE_TABLET | Freq: Every day | ORAL | Status: DC
  Administered 2013-08-09 – 2013-08-13 (×5): 40 mg via ORAL
  Filled 2013-08-09 (×4): qty 1

## 2013-08-09 MED ORDER — ALPRAZOLAM 1 MG PO TABS
1.0000 mg | ORAL_TABLET | Freq: Every day | ORAL | Status: DC
  Administered 2013-08-09 – 2013-08-12 (×4): 1 mg via ORAL
  Filled 2013-08-09 (×4): qty 1

## 2013-08-09 MED ORDER — LEVOTHYROXINE SODIUM 88 MCG PO TABS
88.0000 ug | ORAL_TABLET | Freq: Every day | ORAL | Status: DC
  Administered 2013-08-09 – 2013-08-13 (×4): 88 ug via ORAL
  Filled 2013-08-09 (×6): qty 1

## 2013-08-09 MED ORDER — FENTANYL CITRATE 0.05 MG/ML IJ SOLN
25.0000 ug | INTRAMUSCULAR | Status: DC | PRN
  Filled 2013-08-09: qty 2

## 2013-08-09 MED ORDER — TETANUS-DIPHTH-ACELL PERTUSSIS 5-2.5-18.5 LF-MCG/0.5 IM SUSP
0.5000 mL | Freq: Once | INTRAMUSCULAR | Status: DC
  Filled 2013-08-09 (×2): qty 0.5

## 2013-08-09 MED ORDER — METOPROLOL SUCCINATE ER 100 MG PO TB24
100.0000 mg | ORAL_TABLET | Freq: Every day | ORAL | Status: DC
  Administered 2013-08-09 – 2013-08-13 (×5): 100 mg via ORAL
  Filled 2013-08-09 (×5): qty 1

## 2013-08-09 MED ORDER — SIMVASTATIN 10 MG PO TABS
10.0000 mg | ORAL_TABLET | Freq: Every day | ORAL | Status: DC
  Administered 2013-08-09 – 2013-08-12 (×4): 10 mg via ORAL
  Filled 2013-08-09 (×5): qty 1

## 2013-08-09 MED ORDER — FENTANYL CITRATE 0.05 MG/ML IJ SOLN
50.0000 ug | Freq: Once | INTRAMUSCULAR | Status: AC
  Administered 2013-08-09: 50 ug via INTRAVENOUS
  Filled 2013-08-09: qty 2

## 2013-08-09 MED ORDER — LATANOPROST 0.005 % OP SOLN
1.0000 [drp] | Freq: Every day | OPHTHALMIC | Status: DC
  Administered 2013-08-09 – 2013-08-12 (×4): 1 [drp] via OPHTHALMIC
  Filled 2013-08-09: qty 2.5

## 2013-08-09 NOTE — H&P (Signed)
[facility] History and Physical  Gloria Martinez WUJ:[id number] DOB: 10-Oct-1919 DOA: 08/08/2013  Referring physician: EDP PCP: Lauro Regulus., MD   Chief Complaint: Fall   HPI: Gloria Martinez is a 79 y.o. female who presents to the ED after a fall.  Fall was initially reported to have occurred earlier today, but on my interview with the patient she states she thinks it was 2 days ago.  Patient apparently was trying to crush a bug on the ground when she lost her balance and fell.  She has had severe R hip pain with weight bearing ever since the fall and so she presented to the ED.  Review of Systems: Systems reviewed.  As above, otherwise negative  Past Medical History  Diagnosis Date  . Hypertension   . Hypothyroidism   . Dementia   . Dysphagia   . Epilepsy   . Diverticulitis   . Muscle weakness   . Hyperlipidemia   . Anxiety   . Glaucoma    Past Surgical History  Procedure Laterality Date  . Hip replacement  Right 2009   Social History:  reports that she has never smoked. She does not have any smokeless tobacco history on file. She reports that she does not drink alcohol or use illicit drugs.  Allergies  Allergen Reactions  . Ciprofloxacin   . Macrobid [employer] Macro]   . Penicillins   . Percocet [Oxycodone-Acetaminophen]   . Sulfa Antibiotics   . Tussionex Pennkinetic Er [Hydrocod Polst-Cpm Polst Er]   . Vicodin [Hydrocodone-Acetaminophen]     No family history on file.   Prior to Admission medications   Medication Sig Start Date End Date Taking? Authorizing Provider  ALPRAZolam Prudy Feeler) 0.5 MG tablet Take 1 mg by mouth at bedtime.    Yes Historical Provider, MD  amitriptyline (ELAVIL) 25 MG tablet Take 25 mg by mouth at bedtime.   Yes Historical Provider, MD  amLODipine (NORVASC) 5 MG tablet Take 5 mg by mouth daily.   Yes Historical Provider, MD  aspirin EC 81 MG tablet Take 81 mg by mouth daily.   Yes Historical Provider, MD  donepezil  (ARICEPT) 5 MG tablet Take 10 mg by mouth at bedtime.    Yes Historical Provider, MD  furosemide (LASIX) 20 MG tablet Take 20 mg by mouth daily.   Yes Historical Provider, MD  latanoprost (XALATAN) 0.005 % ophthalmic solution Place 1 drop into both eyes at bedtime.   Yes Historical Provider, MD  levothyroxine (SYNTHROID, LEVOTHROID) 88 MCG tablet Take 88 mcg by mouth daily before breakfast.   Yes Historical Provider, MD  lovastatin (MEVACOR) 20 MG tablet Take 40 mg by mouth at bedtime.   Yes Historical Provider, MD  metoprolol succinate (TOPROL-XL) 50 MG 24 hr tablet Take 100 mg by mouth daily. Take with or immediately following a meal.   Yes Historical Provider, MD  omeprazole (PRILOSEC) 20 MG capsule Take 20 mg by mouth daily.   Yes Historical Provider, MD  potassium chloride 20 MEQ/15ML (10%) solution Take 10 mEq by mouth daily.   Yes Historical Provider, MD  senna-docusate (SENOKOT-S) 8.6-50 MG per tablet Take 1 tablet by mouth 2 (two) times daily.   Yes Historical Provider, MD   Physical Exam: Filed Vitals:   08/09/13 0414  BP: 179/82  Pulse: 82  Temp: 98.1 F (36.7 C)  Resp: 21    BP 179/82  Pulse 82  Temp(Src) 98.1 F (36.7 C) (Oral)  Resp 21  SpO2 94%  General Appearance:  Alert, oriented, no distress, appears stated age  Head:    Normocephalic, atraumatic  Eyes:    PERRL, EOMI, sclera non-icteric        Nose:   Nares without drainage or epistaxis. Mucosa, turbinates normal  Throat:   Moist mucous membranes. Oropharynx without erythema or exudate.  Neck:   Supple. No carotid bruits.  No thyromegaly.  No lymphadenopathy.   Back:     No CVA tenderness, no spinal tenderness  Lungs:     Clear to auscultation bilaterally, without wheezes, rhonchi or rales  Chest wall:    No tenderness to palpitation  Heart:    Regular rate and rhythm without murmurs, gallops, rubs  Abdomen:     Soft, non-tender, nondistended, normal bowel sounds, no organomegaly  Genitalia:    deferred   Rectal:    deferred  Extremities:   No clubbing, cyanosis or edema.  Pulses:   2+ and symmetric all extremities  Skin:   Skin color, texture, turgor normal, no rashes or lesions  Lymph nodes:   Cervical, supraclavicular, and axillary nodes normal  Neurologic:   CNII-XII intact. Normal strength, sensation and reflexes      throughout    Labs on Admission:  Basic Metabolic Panel:  Recent Labs Lab 08/09/13 0345  NA 137  K 4.0  CL 99  CO2 24  GLUCOSE 152*  BUN 14  CREATININE 1.00  CALCIUM 9.3   Liver Function Tests: No results found for this basename: AST, ALT, ALKPHOS, BILITOT, PROT, ALBUMIN,  in the last 168 hours No results found for this basename: LIPASE, AMYLASE,  in the last 168 hours No results found for this basename: AMMONIA,  in the last 168 hours CBC:  Recent Labs Lab 08/09/13 0345  WBC 12.2*  NEUTROABS 9.8*  HGB 13.9  HCT 40.8  MCV 90.7  PLT 208   Cardiac Enzymes: No results found for this basename: CKTOTAL, CKMB, CKMBINDEX, TROPONINI,  in the last 168 hours  BNP (last 3 results) No results found for this basename: PROBNP,  in the last 8760 hours CBG: No results found for this basename: GLUCAP,  in the last 168 hours  Radiological Exams on Admission: Dg Hip Complete Right  08/09/2013   CLINICAL DATA:  Fall.  Right lateral hip pain.  EXAM: RIGHT HIP - COMPLETE 2+ VIEW  COMPARISON:  None.  FINDINGS: Right total hip arthroplasty is present. Femoral stem appears within normal limits. There is no fracture identified. Small amount of lucency around the superior margin of the right acetabular cup could represent early loosening. There is no periprosthetic fracture identified.  IMPRESSION: No acute osseous abnormality.   Electronically Signed   By: Andreas Newport M.D.   On: 08/09/2013 01:00   Ct Head Wo Contrast  08/09/2013   CLINICAL DATA:  Fall.  Dementia.  Head trauma.  Neck pain.  EXAM: CT HEAD WITHOUT CONTRAST  CT CERVICAL SPINE WITHOUT CONTRAST   TECHNIQUE: Multidetector CT imaging of the head and cervical spine was performed following the standard protocol without intravenous contrast. Multiplanar CT image reconstructions of the cervical spine were also generated.  COMPARISON:  None.  FINDINGS: CT HEAD FINDINGS  No mass lesion, mass effect, midline shift, hydrocephalus, hemorrhage. No acute territorial cortical ischemia/infarct. Atrophy and chronic ischemic white matter disease is present. Calvarium is intact. Intracranial atherosclerosis.  CT CERVICAL SPINE FINDINGS  There is a levoconvex torticollis. Motion artifact is present on the examination. Multilevel cervical spondylosis and facet arthrosis. Odontoid and occipital  condyles appear intact. In the posterior fossa, vertebral basilar dolichoectasia is partially visualized. Lung apices appear within normal limits. Osteopenia. There is no cervical spine fracture or dislocation. Anterolisthesis of C3 on C4 and C4 on C5 appears degenerative. This measures about 2-3 mm at both levels. Multilevel disc osteophyte complexes are present. Carotid atherosclerosis incidentally noted.  IMPRESSION: 1. Atrophy and chronic ischemic white matter disease without acute intracranial abnormality. 2. Cervical spondylosis and facet arthrosis without acute osseous abnormality.   Electronically Signed   By: Andreas Newport M.D.   On: 08/09/2013 01:10   Ct Cervical Spine Wo Contrast  08/09/2013   CLINICAL DATA:  Fall.  Dementia.  Head trauma.  Neck pain.  EXAM: CT HEAD WITHOUT CONTRAST  CT CERVICAL SPINE WITHOUT CONTRAST  TECHNIQUE: Multidetector CT imaging of the head and cervical spine was performed following the standard protocol without intravenous contrast. Multiplanar CT image reconstructions of the cervical spine were also generated.  COMPARISON:  None.  FINDINGS: CT HEAD FINDINGS  No mass lesion, mass effect, midline shift, hydrocephalus, hemorrhage. No acute territorial cortical ischemia/infarct. Atrophy and chronic  ischemic white matter disease is present. Calvarium is intact. Intracranial atherosclerosis.  CT CERVICAL SPINE FINDINGS  There is a levoconvex torticollis. Motion artifact is present on the examination. Multilevel cervical spondylosis and facet arthrosis. Odontoid and occipital condyles appear intact. In the posterior fossa, vertebral basilar dolichoectasia is partially visualized. Lung apices appear within normal limits. Osteopenia. There is no cervical spine fracture or dislocation. Anterolisthesis of C3 on C4 and C4 on C5 appears degenerative. This measures about 2-3 mm at both levels. Multilevel disc osteophyte complexes are present. Carotid atherosclerosis incidentally noted.  IMPRESSION: 1. Atrophy and chronic ischemic white matter disease without acute intracranial abnormality. 2. Cervical spondylosis and facet arthrosis without acute osseous abnormality.   Electronically Signed   By: Andreas Newport M.D.   On: 08/09/2013 01:10   Ct Hip Right Wo Contrast  08/09/2013   CLINICAL DATA:  Right hip pain.  EXAM: CT OF THE RIGHT HIP WITHOUT CONTRAST  TECHNIQUE: Multidetector CT imaging was performed according to the standard protocol. Multiplanar CT image reconstructions were also generated.  COMPARISON:  Right hip radiographs performed earlier today at 12:50 a.m.  FINDINGS: There appears to be an incomplete oblique fracture line extending across the lateral cortex of the proximal fibular diaphysis, extending likely to the prosthesis. The patient's right hip prosthesis is otherwise unremarkable in appearance. There is no evidence of loosening. The prosthesis is noted in expected alignment. The right superior and inferior pubic rami are unremarkable in appearance.  Relatively diffuse calcification is noted along the right common and superficial femoral artery. Mild diverticulosis is noted along the visualized sigmoid colon, without evidence for diverticulitis. The bladder is grossly unremarkable in appearance,  though not well assessed due to metal artifact. There is no evidence of significant soft tissue injury.  IMPRESSION: 1. Incomplete oblique fracture line noted extending across the lateral cortex of the proximal fibular diaphysis, likely extending to the prosthesis. Given the incomplete nature of the fracture line, there is no evidence of displacement. 2. Right hip prosthesis is otherwise unremarkable in appearance, without evidence of loosening. 3. Relatively diffuse calcification along the right common and superficial femoral artery. 4. Mild diverticulosis along the visualized sigmoid colon, without evidence of diverticulitis.   Electronically Signed   By: Roanna Raider M.D.   On: 08/09/2013 03:00    EKG: Independently reviewed.  Assessment/Plan Active Problems:   Hip fracture  1. R hip fracture - partial fracture of the R hip, non-displaced and appears to be held in place by the R hip prosthesis from a previous hip replacement surgery.  Dr Ophelia Charter to evaluate today, but initial orthopaedic surgery opinion sounds like this may be able to heal on its own without further surgical intervention.  Keeping patient NPO and bed rest just in case but suspect that she will likely be able to have diet advanced as soon as she is evaluated by ortho surgery.  Pain control with PRN fentanyl for now.    Code Status: Full Code  Family Communication: No family in room Disposition Plan: Admit to inpatient   Time spent: 50 min  GARDNER, JARED M. [facility] Pager [phone]  If 7AM-7PM, please contact the day team taking care of the patient [email or web address] Password [facility] 08/09/2013, 5:13 AM

## 2013-08-09 NOTE — Progress Notes (Signed)
Patient refusing to have peripheral IV placed. Dr.Singh notified. No new orders given

## 2013-08-09 NOTE — Progress Notes (Signed)
[facility]                                                                                Patient Demographics  Gloria Martinez, is a 79 y.o. female, DOB - 07-31-1920, ZOX:[id number]  Admit date - 08/08/2013   Admitting Physician Hillary Bow, DO  Outpatient Primary MD for the patient is Lauro Regulus., MD  LOS - 1   Chief Complaint  Patient presents with  . Fall        Assessment & Plan    Patient admitted few hours ago by my partner for a trip and fall at nursing home causing    1. Right hip pain and discomfort secondary to partial incomplete fibular diaphysis fracture. She is a previous hip prosthesis on the right side.- Orthopedic physician Dr. August Saucer saw the patient in the ER and requested that patient be seen this morning by his partner Dr. Ophelia Charter, Dr. Ophelia Charter to evaluate the patient is morning to decide if patient needs surgical intervention. I have discussed her care in detail with her power of attorney Dorinda Hill phone number [phone].   Cardio-Pulm Risk stratification for surgery and recommendations to minimize the same:-  A.Cardio-Pulmonary Risk -  this patient is a moderate to high for adverse Cardio-Pulmonary  Outcome  from surgery, the risks and benefits were discussed and acceptable to Dorinda Hill power of attorney. Dr. Ophelia Charter will discuss all surgical and nonsurgical options with the patient and family.  Recommendations for optimizing Cardio-Pulmonary  Risk risk factors  1. Keep SBP<140, HR<85, use Lopressor 5mg  IV q4hrs PRN, or B.Blocker drip PRN. 2. Moniotr I&Os. 3. Minimal sedation and Narcotics. 4. Good pulmunary toilet. 5. PRN Nebs and as needed oxygen to keep Pox>90% 6. Hb>8, transfuse as needed- Lasix 10mg  IV after each unit PRBC Transfused.   B.Bleeding Risk - no previous surgical complications, no easy bruising. She was on baby aspirin at home   Lab Results  Component Value Date   PLT 208 08/09/2013                  No results found for this  basename: INR, PROTIME    Baseline EKG shows sinus rhythm with no acute changes. No loud murmurs on exam. Denies any history of CAD CHF, no history of exertional angina or CHF either.   Will request Surgeon to please Order Lovenox/DVT prophylaxis of his/her choice when OK from Surgeon's standpoint post op. For now she is on heparin.      2. Hypertension. Continue Toprol-XL, will give medications with a sip of water. IV Lopressor ordered an as-needed basis.     3. Hypothyroidism. Continue Synthroid at home dose.     4. Dyslipidemia continue statin at home dose.    Code Status: Full for now  Family Communication: Stressed with power of attorney Dorinda Hill phone #[phone]  Disposition Plan: Rehabilitation/nursing home   Procedures CT scan of the hip   Consults  Ortho physicians Dr. August Saucer and Ophelia Charter   Medications  Scheduled Meds: . ALPRAZolam  1 mg Oral QHS  . amitriptyline  25 mg Oral QHS  . amLODipine  5 mg Oral Daily  . aspirin EC  81  mg Oral Daily  . donepezil  10 mg Oral QHS  . furosemide  20 mg Oral Daily  . heparin  5,000 Units Subcutaneous Q8H  . latanoprost  1 drop Both Eyes QHS  . levothyroxine  88 mcg Oral QAC breakfast  . metoprolol succinate  100 mg Oral Daily  . pantoprazole  40 mg Oral Daily  . potassium chloride  10 mEq Oral Daily  . senna-docusate  1 tablet Oral BID  . simvastatin  10 mg Oral q1800  . Tdap  0.5 mL Intramuscular Once   Continuous Infusions: . sodium chloride     PRN Meds:.fentaNYL, metoprolol  DVT Prophylaxis  heparin  Lab Results  Component Value Date   PLT 208 08/09/2013    Antibiotics    Anti-infectives   None          Subjective:   Vallarie Fei today has, No headache, No chest pain, No abdominal pain - No Nausea, No new weakness tingling or numbness, No Cough - SOB. Had right-sided hip discomfort  Objective:   Filed Vitals:   08/09/13 0145 08/09/13 0414 08/09/13 0546 08/09/13 0700  BP: 167/72 179/82  150/66   Pulse:  82 81   Temp:  98.1 F (36.7 C) 98.8 F (37.1 C)   TempSrc:  Oral Oral   Resp: 17 21 18    Height:   5\' 6"  (1.676 m)   Weight:   65.3 kg (143 lb 15.4 oz)   SpO2: 92% 94% 92% 94%    Wt Readings from Last 3 Encounters:  08/09/13 65.3 kg (143 lb 15.4 oz)    No intake or output data in the 24 hours ending 08/09/13 0921  Exam Awake , Oriented X 1, No new F.N deficits, Normal affect Hubbard.AT,PERRAL Supple Neck,No JVD, No cervical lymphadenopathy appriciated.  Symmetrical Chest wall movement, Good air movement bilaterally, CTAB RRR,No Gallops,Rubs or new Murmurs, No Parasternal Heave +ve B.Sounds, Abd Soft, Non tender, No organomegaly appriciated, No rebound - guarding or rigidity. No Cyanosis, Clubbing or edema, No new Rash or bruise, right leg appears to be internally rotated    Data Review   Micro Results No results found for this or any previous visit (from the past 240 hour(s)).  Radiology Reports Dg Hip Complete Right  08/09/2013   CLINICAL DATA:  Fall.  Right lateral hip pain.  EXAM: RIGHT HIP - COMPLETE 2+ VIEW  COMPARISON:  None.  FINDINGS: Right total hip arthroplasty is present. Femoral stem appears within normal limits. There is no fracture identified. Small amount of lucency around the superior margin of the right acetabular cup could represent early loosening. There is no periprosthetic fracture identified.  IMPRESSION: No acute osseous abnormality.   Electronically Signed   By: Andreas Newport M.D.   On: 08/09/2013 01:00   Ct Head Wo Contrast  08/09/2013   CLINICAL DATA:  Fall.  Dementia.  Head trauma.  Neck pain.  EXAM: CT HEAD WITHOUT CONTRAST  CT CERVICAL SPINE WITHOUT CONTRAST  TECHNIQUE: Multidetector CT imaging of the head and cervical spine was performed following the standard protocol without intravenous contrast. Multiplanar CT image reconstructions of the cervical spine were also generated.  COMPARISON:  None.  FINDINGS: CT HEAD FINDINGS  No mass  lesion, mass effect, midline shift, hydrocephalus, hemorrhage. No acute territorial cortical ischemia/infarct. Atrophy and chronic ischemic white matter disease is present. Calvarium is intact. Intracranial atherosclerosis.  CT CERVICAL SPINE FINDINGS  There is a levoconvex torticollis. Motion artifact is present on the  examination. Multilevel cervical spondylosis and facet arthrosis. Odontoid and occipital condyles appear intact. In the posterior fossa, vertebral basilar dolichoectasia is partially visualized. Lung apices appear within normal limits. Osteopenia. There is no cervical spine fracture or dislocation. Anterolisthesis of C3 on C4 and C4 on C5 appears degenerative. This measures about 2-3 mm at both levels. Multilevel disc osteophyte complexes are present. Carotid atherosclerosis incidentally noted.  IMPRESSION: 1. Atrophy and chronic ischemic white matter disease without acute intracranial abnormality. 2. Cervical spondylosis and facet arthrosis without acute osseous abnormality.   Electronically Signed   By: Andreas Newport M.D.   On: 08/09/2013 01:10   Ct Cervical Spine Wo Contrast  08/09/2013   CLINICAL DATA:  Fall.  Dementia.  Head trauma.  Neck pain.  EXAM: CT HEAD WITHOUT CONTRAST  CT CERVICAL SPINE WITHOUT CONTRAST  TECHNIQUE: Multidetector CT imaging of the head and cervical spine was performed following the standard protocol without intravenous contrast. Multiplanar CT image reconstructions of the cervical spine were also generated.  COMPARISON:  None.  FINDINGS: CT HEAD FINDINGS  No mass lesion, mass effect, midline shift, hydrocephalus, hemorrhage. No acute territorial cortical ischemia/infarct. Atrophy and chronic ischemic white matter disease is present. Calvarium is intact. Intracranial atherosclerosis.  CT CERVICAL SPINE FINDINGS  There is a levoconvex torticollis. Motion artifact is present on the examination. Multilevel cervical spondylosis and facet arthrosis. Odontoid and occipital  condyles appear intact. In the posterior fossa, vertebral basilar dolichoectasia is partially visualized. Lung apices appear within normal limits. Osteopenia. There is no cervical spine fracture or dislocation. Anterolisthesis of C3 on C4 and C4 on C5 appears degenerative. This measures about 2-3 mm at both levels. Multilevel disc osteophyte complexes are present. Carotid atherosclerosis incidentally noted.  IMPRESSION: 1. Atrophy and chronic ischemic white matter disease without acute intracranial abnormality. 2. Cervical spondylosis and facet arthrosis without acute osseous abnormality.   Electronically Signed   By: Andreas Newport M.D.   On: 08/09/2013 01:10   Ct Hip Right Wo Contrast  08/09/2013   CLINICAL DATA:  Right hip pain.  EXAM: CT OF THE RIGHT HIP WITHOUT CONTRAST  TECHNIQUE: Multidetector CT imaging was performed according to the standard protocol. Multiplanar CT image reconstructions were also generated.  COMPARISON:  Right hip radiographs performed earlier today at 12:50 a.m.  FINDINGS: There appears to be an incomplete oblique fracture line extending across the lateral cortex of the proximal fibular diaphysis, extending likely to the prosthesis. The patient's right hip prosthesis is otherwise unremarkable in appearance. There is no evidence of loosening. The prosthesis is noted in expected alignment. The right superior and inferior pubic rami are unremarkable in appearance.  Relatively diffuse calcification is noted along the right common and superficial femoral artery. Mild diverticulosis is noted along the visualized sigmoid colon, without evidence for diverticulitis. The bladder is grossly unremarkable in appearance, though not well assessed due to metal artifact. There is no evidence of significant soft tissue injury.  IMPRESSION: 1. Incomplete oblique fracture line noted extending across the lateral cortex of the proximal fibular diaphysis, likely extending to the prosthesis. Given the  incomplete nature of the fracture line, there is no evidence of displacement. 2. Right hip prosthesis is otherwise unremarkable in appearance, without evidence of loosening. 3. Relatively diffuse calcification along the right common and superficial femoral artery. 4. Mild diverticulosis along the visualized sigmoid colon, without evidence of diverticulitis.   Electronically Signed   By: Roanna Raider M.D.   On: 08/09/2013 03:00    CBC  Recent Labs Lab 08/09/13 0345  WBC 12.2*  HGB 13.9  HCT 40.8  PLT 208  MCV 90.7  MCH 30.9  MCHC 34.1  RDW 13.7  LYMPHSABS 1.3  MONOABS 1.0  EOSABS 0.1  BASOSABS 0.0    Chemistries   Recent Labs Lab 08/09/13 0345  NA 137  K 4.0  CL 99  CO2 24  GLUCOSE 152*  BUN 14  CREATININE 1.00  CALCIUM 9.3   ------------------------------------------------------------------------------------------------------------------ estimated creatinine clearance is 34.3 ml/min (by C-G formula based on Cr of 1). ------------------------------------------------------------------------------------------------------------------ No results found for this basename: HGBA1C,  in the last 72 hours ------------------------------------------------------------------------------------------------------------------ No results found for this basename: CHOL, HDL, LDLCALC, TRIG, CHOLHDL, LDLDIRECT,  in the last 72 hours ------------------------------------------------------------------------------------------------------------------ No results found for this basename: TSH, T4TOTAL, FREET3, T3FREE, THYROIDAB,  in the last 72 hours ------------------------------------------------------------------------------------------------------------------ No results found for this basename: VITAMINB12, FOLATE, FERRITIN, TIBC, IRON, RETICCTPCT,  in the last 72 hours  Coagulation profile No results found for this basename: INR, PROTIME,  in the last 168 hours  No results found for this  basename: DDIMER,  in the last 72 hours  Cardiac Enzymes No results found for this basename: CK, CKMB, TROPONINI, MYOGLOBIN,  in the last 168 hours ------------------------------------------------------------------------------------------------------------------ No components found with this basename: POCBNP,      Time Spent in minutes  45   Pheng Prokop K M.D on 08/09/2013 at 9:21 AM  Between 7am to 7pm - Pager - [phone]  After 7pm go to [email or web address] - password [email or web address]  And look for the night coverage person covering for me after hours  [facility] Office  [phone]

## 2013-08-09 NOTE — ED Provider Notes (Signed)
CSN: [id number]     Arrival date & time 08/08/13  2338 History   First MD Initiated Contact with Patient 08/09/13 0025     Chief Complaint  Patient presents with  . Fall   (Consider location/radiation/quality/duration/timing/severity/associated sxs/prior Treatment) HPI Comments: Patient is a 79 year old female with history of prior right hip replacement who presents today after a fall. She reports that she was walking with her 2 wheeled walker and saw what she thought was a piece of trash on the ground. She realized it was a bug and tried to crush the bug. It was at that time that she lost her balance and fell. She hit her head, right elbow, right hip. Currently her only complaint is hip pain. She was unable to walk and therefore called EMS. She is unable to give a quality to her pain saying "it's just a pain". She lives at home alone. He denies any headache, weakness, numbness, paresthesias, nausea, vomiting, abdominal pain. She is accompanied by her brother in law.   The history is provided by the patient. No language interpreter was used.    Past Medical History  Diagnosis Date  . Hypertension   . Hypothyroidism   . Dementia    Past Surgical History  Procedure Laterality Date  . Hip replacement  Right 2009   No family history on file. History  Substance Use Topics  . Smoking status: Never Smoker   . Smokeless tobacco: Not on file  . Alcohol Use: No   OB History   Grav Para Term Preterm Abortions TAB SAB Ect Mult Living                 Review of Systems  Constitutional: Negative for fever and chills.  Eyes: Negative for photophobia and visual disturbance.  Respiratory: Negative for shortness of breath.   Cardiovascular: Negative for chest pain.  Gastrointestinal: Negative for nausea, vomiting and abdominal pain.  Musculoskeletal: Positive for arthralgias, gait problem and myalgias.  Skin: Positive for wound.  Neurological: Negative for headaches.  All other systems  reviewed and are negative.    Allergies  Ciprofloxacin; Macrobid; Penicillins; Percocet; Sulfa antibiotics; Tussionex pennkinetic er; and Vicodin  Home Medications   Current Outpatient Rx  Name  Route  Sig  Dispense  Refill  . ALPRAZolam (XANAX) 0.5 MG tablet   Oral   Take 1 mg by mouth at bedtime.          Marland Kitchen amitriptyline (ELAVIL) 25 MG tablet   Oral   Take 25 mg by mouth at bedtime.         Marland Kitchen amLODipine (NORVASC) 5 MG tablet   Oral   Take 5 mg by mouth daily.         Marland Kitchen aspirin EC 81 MG tablet   Oral   Take 81 mg by mouth daily.         Marland Kitchen donepezil (ARICEPT) 5 MG tablet   Oral   Take 10 mg by mouth at bedtime.          . furosemide (LASIX) 20 MG tablet   Oral   Take 20 mg by mouth daily.         Marland Kitchen latanoprost (XALATAN) 0.005 % ophthalmic solution   Both Eyes   Place 1 drop into both eyes at bedtime.         Marland Kitchen levothyroxine (SYNTHROID, LEVOTHROID) 88 MCG tablet   Oral   Take 88 mcg by mouth daily before breakfast.         .  lovastatin (MEVACOR) 20 MG tablet   Oral   Take 40 mg by mouth at bedtime.         . metoprolol succinate (TOPROL-XL) 50 MG 24 hr tablet   Oral   Take 100 mg by mouth daily. Take with or immediately following a meal.         . omeprazole (PRILOSEC) 20 MG capsule   Oral   Take 20 mg by mouth daily.         . potassium chloride 20 MEQ/15ML (10%) solution   Oral   Take 10 mEq by mouth daily.         Marland Kitchen senna-docusate (SENOKOT-S) 8.6-50 MG per tablet   Oral   Take 1 tablet by mouth 2 (two) times daily.          BP 164/73  Pulse 78  Temp(Src) 98.2 F (36.8 C) (Oral)  Resp 17  SpO2 92% Physical Exam  Nursing note and vitals reviewed. Constitutional: She is oriented to person, place, and time. She appears well-developed and well-nourished. No distress.  HENT:  Head: Normocephalic and atraumatic.  Right Ear: External ear normal.  Left Ear: External ear normal.  Nose: Nose normal.  Mouth/Throat:  Oropharynx is clear and moist.  Eyes: Conjunctivae are normal.  Neck: Normal range of motion.  Cardiovascular: Normal rate, regular rhythm, normal heart sounds, intact distal pulses and normal pulses.   Pulses:      Dorsalis pedis pulses are 2+ on the right side, and 2+ on the left side.       Posterior tibial pulses are 2+ on the right side, and 2+ on the left side.  Cap refill < 3 seconds in all toes  Pulmonary/Chest: Effort normal and breath sounds normal. No stridor. No respiratory distress. She has no wheezes. She has no rales.  Abdominal: Soft. She exhibits no distension.  Musculoskeletal: Normal range of motion.       Legs: Full ROM of right elbow and right shoulder. TTP over right hip and proximal femur. Able to move leg, but has increased pain. Unable to ambulate.   Neurological: She is alert and oriented to person, place, and time. She has normal strength.  Skin: Skin is warm and dry. She is not diaphoretic. No erythema.  Abrasion to right elbow  Psychiatric: She has a normal mood and affect. Her behavior is normal.    ED Course  Procedures (including critical care time) Labs Review Labs Reviewed  CBC WITH DIFFERENTIAL - Abnormal; Notable for the following:    WBC 12.2 (*)    Neutrophils Relative % 80 (*)    Neutro Abs 9.8 (*)    Lymphocytes Relative 11 (*)    All other components within normal limits  BASIC METABOLIC PANEL - Abnormal; Notable for the following:    Glucose, Bld 152 (*)    GFR calc non Af Amer 48 (*)    GFR calc Af Amer 55 (*)    All other components within normal limits   Imaging Review Dg Hip Complete Right  08/09/2013   CLINICAL DATA:  Fall.  Right lateral hip pain.  EXAM: RIGHT HIP - COMPLETE 2+ VIEW  COMPARISON:  None.  FINDINGS: Right total hip arthroplasty is present. Femoral stem appears within normal limits. There is no fracture identified. Small amount of lucency around the superior margin of the right acetabular cup could represent early  loosening. There is no periprosthetic fracture identified.  IMPRESSION: No acute osseous abnormality.   Electronically  Signed   By: Andreas Newport M.D.   On: 08/09/2013 01:00   Ct Head Wo Contrast  08/09/2013   CLINICAL DATA:  Fall.  Dementia.  Head trauma.  Neck pain.  EXAM: CT HEAD WITHOUT CONTRAST  CT CERVICAL SPINE WITHOUT CONTRAST  TECHNIQUE: Multidetector CT imaging of the head and cervical spine was performed following the standard protocol without intravenous contrast. Multiplanar CT image reconstructions of the cervical spine were also generated.  COMPARISON:  None.  FINDINGS: CT HEAD FINDINGS  No mass lesion, mass effect, midline shift, hydrocephalus, hemorrhage. No acute territorial cortical ischemia/infarct. Atrophy and chronic ischemic white matter disease is present. Calvarium is intact. Intracranial atherosclerosis.  CT CERVICAL SPINE FINDINGS  There is a levoconvex torticollis. Motion artifact is present on the examination. Multilevel cervical spondylosis and facet arthrosis. Odontoid and occipital condyles appear intact. In the posterior fossa, vertebral basilar dolichoectasia is partially visualized. Lung apices appear within normal limits. Osteopenia. There is no cervical spine fracture or dislocation. Anterolisthesis of C3 on C4 and C4 on C5 appears degenerative. This measures about 2-3 mm at both levels. Multilevel disc osteophyte complexes are present. Carotid atherosclerosis incidentally noted.  IMPRESSION: 1. Atrophy and chronic ischemic white matter disease without acute intracranial abnormality. 2. Cervical spondylosis and facet arthrosis without acute osseous abnormality.   Electronically Signed   By: Andreas Newport M.D.   On: 08/09/2013 01:10   Ct Cervical Spine Wo Contrast  08/09/2013   CLINICAL DATA:  Fall.  Dementia.  Head trauma.  Neck pain.  EXAM: CT HEAD WITHOUT CONTRAST  CT CERVICAL SPINE WITHOUT CONTRAST  TECHNIQUE: Multidetector CT imaging of the head and cervical spine  was performed following the standard protocol without intravenous contrast. Multiplanar CT image reconstructions of the cervical spine were also generated.  COMPARISON:  None.  FINDINGS: CT HEAD FINDINGS  No mass lesion, mass effect, midline shift, hydrocephalus, hemorrhage. No acute territorial cortical ischemia/infarct. Atrophy and chronic ischemic white matter disease is present. Calvarium is intact. Intracranial atherosclerosis.  CT CERVICAL SPINE FINDINGS  There is a levoconvex torticollis. Motion artifact is present on the examination. Multilevel cervical spondylosis and facet arthrosis. Odontoid and occipital condyles appear intact. In the posterior fossa, vertebral basilar dolichoectasia is partially visualized. Lung apices appear within normal limits. Osteopenia. There is no cervical spine fracture or dislocation. Anterolisthesis of C3 on C4 and C4 on C5 appears degenerative. This measures about 2-3 mm at both levels. Multilevel disc osteophyte complexes are present. Carotid atherosclerosis incidentally noted.  IMPRESSION: 1. Atrophy and chronic ischemic white matter disease without acute intracranial abnormality. 2. Cervical spondylosis and facet arthrosis without acute osseous abnormality.   Electronically Signed   By: Andreas Newport M.D.   On: 08/09/2013 01:10   Ct Hip Right Wo Contrast  08/09/2013   CLINICAL DATA:  Right hip pain.  EXAM: CT OF THE RIGHT HIP WITHOUT CONTRAST  TECHNIQUE: Multidetector CT imaging was performed according to the standard protocol. Multiplanar CT image reconstructions were also generated.  COMPARISON:  Right hip radiographs performed earlier today at 12:50 a.m.  FINDINGS: There appears to be an incomplete oblique fracture line extending across the lateral cortex of the proximal fibular diaphysis, extending likely to the prosthesis. The patient's right hip prosthesis is otherwise unremarkable in appearance. There is no evidence of loosening. The prosthesis is noted in  expected alignment. The right superior and inferior pubic rami are unremarkable in appearance.  Relatively diffuse calcification is noted along the right common and superficial femoral  artery. Mild diverticulosis is noted along the visualized sigmoid colon, without evidence for diverticulitis. The bladder is grossly unremarkable in appearance, though not well assessed due to metal artifact. There is no evidence of significant soft tissue injury.  IMPRESSION: 1. Incomplete oblique fracture line noted extending across the lateral cortex of the proximal fibular diaphysis, likely extending to the prosthesis. Given the incomplete nature of the fracture line, there is no evidence of displacement. 2. Right hip prosthesis is otherwise unremarkable in appearance, without evidence of loosening. 3. Relatively diffuse calcification along the right common and superficial femoral artery. 4. Mild diverticulosis along the visualized sigmoid colon, without evidence of diverticulitis.   Electronically Signed   By: Roanna Raider M.D.   On: 08/09/2013 03:00    EKG Interpretation   None      4:40 AM Discussed case with Dr. August Saucer who recommends admission to medicine and Dr. Ophelia Charter will round on her in the morning.   MDM   1. Hip fracture, right, closed, initial encounter    Pt presents after mechanical fall. CT scan shows right hip fx unable to be seen on XR. Pt's ortho doctor is in [place], but she has not seen the surgeon since 2009. Pain is controlled in ED. Neurovascularly intact. Compartment soft. I discussed this case with Dr. August Saucer who recommends admission to medicine and for the patient to be seen by Dr. Ophelia Charter when he rounds in the morning. I discussed this with medicine who agree to admit. Admission and consult are appreciated. Vital signs stable for transfer to the floor. Patient / Family / Caregiver informed of clinical course, understand medical decision-making process, and agree with plan.     Mora Bellman, PA-C 08/09/13 [phone]

## 2013-08-09 NOTE — Progress Notes (Signed)
Patients watch given to Gloria Martinez to take home.

## 2013-08-09 NOTE — Consult Note (Addendum)
Reason for Consult:right nondisplaced periprothetic hip fracture Referring Physician: P.  Thedore Mins  MD  Gloria Martinez is an 79 y.o. female.  HPI: 79 yo assisted living pt staying at [facility]  assisted living with fall and non displaced prox femur fracture at mid portion of stem.   Past Medical History  Diagnosis Date  . Hypertension   . Hypothyroidism   . Dementia   . Dysphagia   . Epilepsy   . Diverticulitis   . Muscle weakness   . Hyperlipidemia   . Anxiety   . Glaucoma     Past Surgical History  Procedure Laterality Date  . Hip replacement  Right 2009    No family history on file.  Social History:  reports that she has never smoked. She does not have any smokeless tobacco history on file. She reports that she does not drink alcohol or use illicit drugs.  Allergies:  Allergies  Allergen Reactions  . Ciprofloxacin   . Macrobid [employer] Macro]   . Penicillins   . Percocet [Oxycodone-Acetaminophen]   . Sulfa Antibiotics   . Tussionex Pennkinetic Er [Hydrocod Polst-Cpm Polst Er]   . Vicodin [Hydrocodone-Acetaminophen]     Medications: I have reviewed the patient's current medications.  Results for orders placed during the hospital encounter of 08/08/13 (from the past 48 hour(s))  CBC WITH DIFFERENTIAL     Status: Abnormal   Collection Time    08/09/13  3:45 AM      Result Value Range   WBC 12.2 (*) 4.0 - 10.5 K/uL   RBC 4.50  3.87 - 5.11 MIL/uL   Hemoglobin 13.9  12.0 - 15.0 g/dL   HCT 16.1  09.6 - 04.5 %   MCV 90.7  78.0 - 100.0 fL   MCH 30.9  26.0 - 34.0 pg   MCHC 34.1  30.0 - 36.0 g/dL   RDW 40.9  81.1 - 91.4 %   Platelets 208  150 - 400 K/uL   Neutrophils Relative % 80 (*) 43 - 77 %   Neutro Abs 9.8 (*) 1.7 - 7.7 K/uL   Lymphocytes Relative 11 (*) 12 - 46 %   Lymphs Abs 1.3  0.7 - 4.0 K/uL   Monocytes Relative 8  3 - 12 %   Monocytes Absolute 1.0  0.1 - 1.0 K/uL   Eosinophils Relative 1  0 - 5 %   Eosinophils Absolute 0.1  0.0 - 0.7 K/uL    Basophils Relative 0  0 - 1 %   Basophils Absolute 0.0  0.0 - 0.1 K/uL  BASIC METABOLIC PANEL     Status: Abnormal   Collection Time    08/09/13  3:45 AM      Result Value Range   Sodium 137  135 - 145 mEq/L   Potassium 4.0  3.5 - 5.1 mEq/L   Chloride 99  96 - 112 mEq/L   CO2 24  19 - 32 mEq/L   Glucose, Bld 152 (*) 70 - 99 mg/dL   BUN 14  6 - 23 mg/dL   Creatinine, Ser 7.82  0.50 - 1.10 mg/dL   Calcium 9.3  8.4 - 95.6 mg/dL   GFR calc non Af Amer 48 (*) >90 mL/min   GFR calc Af Amer 55 (*) >90 mL/min   Comment: (NOTE)     The eGFR has been calculated using the CKD EPI equation.     This calculation has not been validated in all clinical situations.  eGFR's persistently <90 mL/min signify possible Chronic Kidney     Disease.    Dg Hip Complete Right  08/09/2013   CLINICAL DATA:  Fall.  Right lateral hip pain.  EXAM: RIGHT HIP - COMPLETE 2+ VIEW  COMPARISON:  None.  FINDINGS: Right total hip arthroplasty is present. Femoral stem appears within normal limits. There is no fracture identified. Small amount of lucency around the superior margin of the right acetabular cup could represent early loosening. There is no periprosthetic fracture identified.  IMPRESSION: No acute osseous abnormality.   Electronically Signed   By: Andreas Newport M.D.   On: 08/09/2013 01:00   Ct Head Wo Contrast  08/09/2013   CLINICAL DATA:  Fall.  Dementia.  Head trauma.  Neck pain.  EXAM: CT HEAD WITHOUT CONTRAST  CT CERVICAL SPINE WITHOUT CONTRAST  TECHNIQUE: Multidetector CT imaging of the head and cervical spine was performed following the standard protocol without intravenous contrast. Multiplanar CT image reconstructions of the cervical spine were also generated.  COMPARISON:  None.  FINDINGS: CT HEAD FINDINGS  No mass lesion, mass effect, midline shift, hydrocephalus, hemorrhage. No acute territorial cortical ischemia/infarct. Atrophy and chronic ischemic white matter disease is present. Calvarium is  intact. Intracranial atherosclerosis.  CT CERVICAL SPINE FINDINGS  There is a levoconvex torticollis. Motion artifact is present on the examination. Multilevel cervical spondylosis and facet arthrosis. Odontoid and occipital condyles appear intact. In the posterior fossa, vertebral basilar dolichoectasia is partially visualized. Lung apices appear within normal limits. Osteopenia. There is no cervical spine fracture or dislocation. Anterolisthesis of C3 on C4 and C4 on C5 appears degenerative. This measures about 2-3 mm at both levels. Multilevel disc osteophyte complexes are present. Carotid atherosclerosis incidentally noted.  IMPRESSION: 1. Atrophy and chronic ischemic white matter disease without acute intracranial abnormality. 2. Cervical spondylosis and facet arthrosis without acute osseous abnormality.   Electronically Signed   By: Andreas Newport M.D.   On: 08/09/2013 01:10   Ct Cervical Spine Wo Contrast  08/09/2013   CLINICAL DATA:  Fall.  Dementia.  Head trauma.  Neck pain.  EXAM: CT HEAD WITHOUT CONTRAST  CT CERVICAL SPINE WITHOUT CONTRAST  TECHNIQUE: Multidetector CT imaging of the head and cervical spine was performed following the standard protocol without intravenous contrast. Multiplanar CT image reconstructions of the cervical spine were also generated.  COMPARISON:  None.  FINDINGS: CT HEAD FINDINGS  No mass lesion, mass effect, midline shift, hydrocephalus, hemorrhage. No acute territorial cortical ischemia/infarct. Atrophy and chronic ischemic white matter disease is present. Calvarium is intact. Intracranial atherosclerosis.  CT CERVICAL SPINE FINDINGS  There is a levoconvex torticollis. Motion artifact is present on the examination. Multilevel cervical spondylosis and facet arthrosis. Odontoid and occipital condyles appear intact. In the posterior fossa, vertebral basilar dolichoectasia is partially visualized. Lung apices appear within normal limits. Osteopenia. There is no cervical spine  fracture or dislocation. Anterolisthesis of C3 on C4 and C4 on C5 appears degenerative. This measures about 2-3 mm at both levels. Multilevel disc osteophyte complexes are present. Carotid atherosclerosis incidentally noted.  IMPRESSION: 1. Atrophy and chronic ischemic white matter disease without acute intracranial abnormality. 2. Cervical spondylosis and facet arthrosis without acute osseous abnormality.   Electronically Signed   By: Andreas Newport M.D.   On: 08/09/2013 01:10   Ct Hip Right Wo Contrast  08/09/2013   CLINICAL DATA:  Right hip pain.  EXAM: CT OF THE RIGHT HIP WITHOUT CONTRAST  TECHNIQUE: Multidetector CT imaging was performed according  to the standard protocol. Multiplanar CT image reconstructions were also generated.  COMPARISON:  Right hip radiographs performed earlier today at 12:50 a.m.  FINDINGS: There appears to be an incomplete oblique fracture line extending across the lateral cortex of the proximal fibular diaphysis, extending likely to the prosthesis. The patient's right hip prosthesis is otherwise unremarkable in appearance. There is no evidence of loosening. The prosthesis is noted in expected alignment. The right superior and inferior pubic rami are unremarkable in appearance.  Relatively diffuse calcification is noted along the right common and superficial femoral artery. Mild diverticulosis is noted along the visualized sigmoid colon, without evidence for diverticulitis. The bladder is grossly unremarkable in appearance, though not well assessed due to metal artifact. There is no evidence of significant soft tissue injury.  IMPRESSION: 1. Incomplete oblique fracture line noted extending across the lateral cortex of the proximal fibular diaphysis, likely extending to the prosthesis. Given the incomplete nature of the fracture line, there is no evidence of displacement. 2. Right hip prosthesis is otherwise unremarkable in appearance, without evidence of loosening. 3. Relatively  diffuse calcification along the right common and superficial femoral artery. 4. Mild diverticulosis along the visualized sigmoid colon, without evidence of diverticulitis.   Electronically Signed   By: Roanna Raider M.D.   On: 08/09/2013 03:00    Review of Systems  Constitutional: Negative for fever.  Eyes:       Glasses  Cardiovascular: Negative for chest pain and palpitations.  Musculoskeletal: Positive for falls.  Skin: Negative for rash.  Neurological: Negative for dizziness.  Psychiatric/Behavioral:       Memory loss, dementia. Pleasant conversant   Blood pressure 150/66, pulse 81, temperature 98.8 F (37.1 C), temperature source Oral, resp. rate 18, height 5\' 6"  (1.676 m), weight 65.3 kg (143 lb 15.4 oz), SpO2 94.00%. Physical Exam  Constitutional: She appears well-nourished.  HENT:  Head: Normocephalic.  Eyes: Pupils are equal, round, and reactive to light.  Neck: Normal range of motion.  Respiratory:  Mild increased respiratory effort  GI: Soft.  Musculoskeletal:  Pain with right hip ROM.   Skin: Skin is warm.  Psychiatric:  Mild confusion     Assessment/Plan: Right nondisplaced femur fracture.  If pt avoids weight bearing for 2 wks and does transfers without falling she will not require surgery. Discussed with her and will call POA to explain.    Furqan Gosselin C 08/09/2013, 11:19 AM

## 2013-08-10 DIAGNOSIS — E876 Hypokalemia: Secondary | ICD-10-CM

## 2013-08-10 DIAGNOSIS — I10 Essential (primary) hypertension: Secondary | ICD-10-CM

## 2013-08-10 DIAGNOSIS — E871 Hypo-osmolality and hyponatremia: Secondary | ICD-10-CM

## 2013-08-10 LAB — CBC
HCT: 38.5 % (ref 36.0–46.0)
MCH: 31.1 pg (ref 26.0–34.0)
MCHC: 34.5 g/dL (ref 30.0–36.0)
MCV: 90.2 fL (ref 78.0–100.0)
RDW: 13.8 % (ref 11.5–15.5)

## 2013-08-10 LAB — BASIC METABOLIC PANEL
BUN: 9 mg/dL (ref 6–23)
CO2: 26 mEq/L (ref 19–32)
Calcium: 9.1 mg/dL (ref 8.4–10.5)
Chloride: 97 mEq/L (ref 96–112)
Creatinine, Ser: 0.82 mg/dL (ref 0.50–1.10)
GFR calc Af Amer: 70 mL/min — ABNORMAL LOW (ref >90)
GFR calc non Af Amer: 61 mL/min — ABNORMAL LOW (ref >90)
Glucose, Bld: 117 mg/dL — ABNORMAL HIGH (ref 70–99)
Sodium: 134 mEq/L — ABNORMAL LOW (ref 135–145)

## 2013-08-10 MED ORDER — POTASSIUM CHLORIDE 20 MEQ/15ML (10%) PO LIQD
20.0000 meq | Freq: Every day | ORAL | Status: DC
  Administered 2013-08-10 – 2013-08-13 (×4): 20 meq via ORAL
  Filled 2013-08-10 (×4): qty 15

## 2013-08-10 MED ORDER — ENSURE COMPLETE PO LIQD
237.0000 mL | Freq: Two times a day (BID) | ORAL | Status: DC
  Administered 2013-08-11 – 2013-08-13 (×5): 237 mL via ORAL

## 2013-08-10 NOTE — Progress Notes (Signed)
Clinical Social Work Department BRIEF PSYCHOSOCIAL ASSESSMENT 08/10/2013  Patient:  Gloria Martinez, Gloria Martinez     Account Number:  [id number]     Admit date:  08/08/2013  Clinical Social Worker:  Candie Chroman  Date/Time:  08/10/2013 12:35 PM  Referred by:  Physician  Date Referred:  08/09/2013 Referred for  SNF Placement   Other Referral:   Interview type:   Other interview type:    PSYCHOSOCIAL DATA Living Status:  FACILITY Admitted from facility:  [facility] Level of care:  Assisted Living Primary support name:  Sherlyn Lees Primary support relationship to patient:  SIBLING Degree of support available:   supportive    CURRENT CONCERNS Current Concerns  Post-Acute Placement   Other Concerns:    SOCIAL WORK ASSESSMENT / PLAN Pt is a 79 yr old female admitted from [facility]. CSW spoke to pt's POA/brother in-law to assist with d/c planning. Pt will need ST Rehab prior to returning to ALF. SNF search has been initiated and bed offers provided. Mr. Blima Ledger will review list with pt's step son and [facility] to determine which SNF would be best for pt. CSW will continue to follow to assist with d/c planning to SNF.   Assessment/plan status:  Psychosocial Support/Ongoing Assessment of Needs Other assessment/ plan:   Information/referral to community resources:   SNF list with bed offers provided. Insurance coverage for SNF reviewed.    PATIENT'S/FAMILY'S RESPONSE TO PLAN OF CARE: " We're trying to avoid surgery. She needs more care than [facility] can provide right now. "   Cori Razor LCSW [phone]

## 2013-08-10 NOTE — Progress Notes (Signed)
PT Cancellation Note  Patient Details Name: Gloria Martinez MRN: [id number] DOB: [birth year]   Cancelled Treatment:     PT deferred - pt refusing OOB.  RN reports pt also refusing MEDS.  Will follow and re-attempt.   Tiane Szydlowski 08/10/2013, 10:17 AM

## 2013-08-10 NOTE — Progress Notes (Signed)
[facility]                                                                                Patient Demographics  Gloria Martinez, is a 79 y.o. female, DOB - 10-08-1919, ZOX:[id number]  Admit date - 08/08/2013   Admitting Physician Hillary Bow, DO  Outpatient Primary MD for the patient is Lauro Regulus., MD  LOS - 2   Chief Complaint  Patient presents with  . Fall        Assessment & Plan      Right hip pain and discomfort secondary to partial incomplete fibular diaphysis fracture. She is a previous hip prosthesis on the right side.- Orthopedic physician Dr. August Saucer saw the patient in the ER and requested that patient be seen this morning by his partner Dr. Ophelia Charter If pt avoids weight bearing for 2 wks and does transfers without falling she will not require surgery Social worker for SNF placement   Hypertension. Continue Toprol-XL   Hypothyroidism. Continue Synthroid at home dose.   Dyslipidemia continue statin at home dose.  Hyponatremia -monitor  hypokalemia -replace  Code Status: Full for now  Family Communication: doctor yesterday discussed with power of attorney Dorinda Hill phone #[phone]  Disposition Plan: SNF   Procedures CT scan of the hip   Consults  Ortho physicians Dr. August Saucer and Ophelia Charter   Medications  Scheduled Meds: . ALPRAZolam  1 mg Oral QHS  . amitriptyline  25 mg Oral QHS  . amLODipine  5 mg Oral Daily  . aspirin EC  81 mg Oral Daily  . donepezil  10 mg Oral QHS  . furosemide  20 mg Oral Daily  . latanoprost  1 drop Both Eyes QHS  . levothyroxine  88 mcg Oral QAC breakfast  . metoprolol succinate  100 mg Oral Daily  . pantoprazole  40 mg Oral Daily  . potassium chloride  10 mEq Oral Daily  . senna-docusate  1 tablet Oral BID  . simvastatin  10 mg Oral q1800  . Tdap  0.5 mL Intramuscular Once   Continuous Infusions: . sodium chloride 50 mL/hr (08/09/13 1040)   PRN Meds:.fentaNYL, metoprolol, ondansetron (ZOFRAN) IV  DVT  Prophylaxis  heparin  Lab Results  Component Value Date   PLT 170 08/10/2013    Antibiotics    Anti-infectives   None          Subjective:   Gloria Martinez  Still having some hip pain No SOB, no CP    Objective:   Filed Vitals:   08/09/13 1500 08/09/13 1800 08/09/13 2245 08/10/13 0711  BP: 157/81 157/88 126/75 148/79  Pulse: 16 18 76 75  Temp: 98.9 F (37.2 C) 98.1 F (36.7 C) 97.3 F (36.3 C) 97.2 F (36.2 C)  TempSrc: Oral Oral Oral Oral  Resp: 16 18 14 14   Height:      Weight:      SpO2: 98% 95% 94% 96%    Wt Readings from Last 3 Encounters:  08/09/13 65.3 kg (143 lb 15.4 oz)     Intake/Output Summary (Last 24 hours) at 08/10/13 9562 Last data filed at 08/10/13 0700  Gross per 24 hour  Intake  0 ml  Output   1300 ml  Net  -1300 ml    Exam Awake , Oriented X 1 Normal affect- pleasant Symmetrical Chest wall movement, Good air movement bilaterally, CTAB RRR,No Gallops,Rubs or new Murmurs +ve B.Sounds, Abd Soft, Non tender, No organomegaly appriciated, No rebound - guarding or rigidity. No Cyanosis, Clubbing or edema, No new Rash or bruise, right leg appears to be internally rotated    Data Review   Micro Results No results found for this or any previous visit (from the past 240 hour(s)).  Radiology Reports Dg Hip Complete Right  08/09/2013   CLINICAL DATA:  Fall.  Right lateral hip pain.  EXAM: RIGHT HIP - COMPLETE 2+ VIEW  COMPARISON:  None.  FINDINGS: Right total hip arthroplasty is present. Femoral stem appears within normal limits. There is no fracture identified. Small amount of lucency around the superior margin of the right acetabular cup could represent early loosening. There is no periprosthetic fracture identified.  IMPRESSION: No acute osseous abnormality.   Electronically Signed   By: Andreas Newport M.D.   On: 08/09/2013 01:00   Ct Head Wo Contrast  08/09/2013   CLINICAL DATA:  Fall.  Dementia.  Head trauma.  Neck pain.  EXAM: CT  HEAD WITHOUT CONTRAST  CT CERVICAL SPINE WITHOUT CONTRAST  TECHNIQUE: Multidetector CT imaging of the head and cervical spine was performed following the standard protocol without intravenous contrast. Multiplanar CT image reconstructions of the cervical spine were also generated.  COMPARISON:  None.  FINDINGS: CT HEAD FINDINGS  No mass lesion, mass effect, midline shift, hydrocephalus, hemorrhage. No acute territorial cortical ischemia/infarct. Atrophy and chronic ischemic white matter disease is present. Calvarium is intact. Intracranial atherosclerosis.  CT CERVICAL SPINE FINDINGS  There is a levoconvex torticollis. Motion artifact is present on the examination. Multilevel cervical spondylosis and facet arthrosis. Odontoid and occipital condyles appear intact. In the posterior fossa, vertebral basilar dolichoectasia is partially visualized. Lung apices appear within normal limits. Osteopenia. There is no cervical spine fracture or dislocation. Anterolisthesis of C3 on C4 and C4 on C5 appears degenerative. This measures about 2-3 mm at both levels. Multilevel disc osteophyte complexes are present. Carotid atherosclerosis incidentally noted.  IMPRESSION: 1. Atrophy and chronic ischemic white matter disease without acute intracranial abnormality. 2. Cervical spondylosis and facet arthrosis without acute osseous abnormality.   Electronically Signed   By: Andreas Newport M.D.   On: 08/09/2013 01:10   Ct Cervical Spine Wo Contrast  08/09/2013   CLINICAL DATA:  Fall.  Dementia.  Head trauma.  Neck pain.  EXAM: CT HEAD WITHOUT CONTRAST  CT CERVICAL SPINE WITHOUT CONTRAST  TECHNIQUE: Multidetector CT imaging of the head and cervical spine was performed following the standard protocol without intravenous contrast. Multiplanar CT image reconstructions of the cervical spine were also generated.  COMPARISON:  None.  FINDINGS: CT HEAD FINDINGS  No mass lesion, mass effect, midline shift, hydrocephalus, hemorrhage. No acute  territorial cortical ischemia/infarct. Atrophy and chronic ischemic white matter disease is present. Calvarium is intact. Intracranial atherosclerosis.  CT CERVICAL SPINE FINDINGS  There is a levoconvex torticollis. Motion artifact is present on the examination. Multilevel cervical spondylosis and facet arthrosis. Odontoid and occipital condyles appear intact. In the posterior fossa, vertebral basilar dolichoectasia is partially visualized. Lung apices appear within normal limits. Osteopenia. There is no cervical spine fracture or dislocation. Anterolisthesis of C3 on C4 and C4 on C5 appears degenerative. This measures about 2-3 mm at both levels. Multilevel disc osteophyte  complexes are present. Carotid atherosclerosis incidentally noted.  IMPRESSION: 1. Atrophy and chronic ischemic white matter disease without acute intracranial abnormality. 2. Cervical spondylosis and facet arthrosis without acute osseous abnormality.   Electronically Signed   By: Andreas Newport M.D.   On: 08/09/2013 01:10   Ct Hip Right Wo Contrast  08/09/2013   CLINICAL DATA:  Right hip pain.  EXAM: CT OF THE RIGHT HIP WITHOUT CONTRAST  TECHNIQUE: Multidetector CT imaging was performed according to the standard protocol. Multiplanar CT image reconstructions were also generated.  COMPARISON:  Right hip radiographs performed earlier today at 12:50 a.m.  FINDINGS: There appears to be an incomplete oblique fracture line extending across the lateral cortex of the proximal fibular diaphysis, extending likely to the prosthesis. The patient's right hip prosthesis is otherwise unremarkable in appearance. There is no evidence of loosening. The prosthesis is noted in expected alignment. The right superior and inferior pubic rami are unremarkable in appearance.  Relatively diffuse calcification is noted along the right common and superficial femoral artery. Mild diverticulosis is noted along the visualized sigmoid colon, without evidence for  diverticulitis. The bladder is grossly unremarkable in appearance, though not well assessed due to metal artifact. There is no evidence of significant soft tissue injury.  IMPRESSION: 1. Incomplete oblique fracture line noted extending across the lateral cortex of the proximal fibular diaphysis, likely extending to the prosthesis. Given the incomplete nature of the fracture line, there is no evidence of displacement. 2. Right hip prosthesis is otherwise unremarkable in appearance, without evidence of loosening. 3. Relatively diffuse calcification along the right common and superficial femoral artery. 4. Mild diverticulosis along the visualized sigmoid colon, without evidence of diverticulitis.   Electronically Signed   By: Roanna Raider M.D.   On: 08/09/2013 03:00    CBC  Recent Labs Lab 08/09/13 0345 08/10/13 0539  WBC 12.2* 7.5  HGB 13.9 13.3  HCT 40.8 38.5  PLT 208 170  MCV 90.7 90.2  MCH 30.9 31.1  MCHC 34.1 34.5  RDW 13.7 13.8  LYMPHSABS 1.3  --   MONOABS 1.0  --   EOSABS 0.1  --   BASOSABS 0.0  --     Chemistries   Recent Labs Lab 08/09/13 0345 08/10/13 0539  NA 137 134*  K 4.0 3.3*  CL 99 97  CO2 24 26  GLUCOSE 152* 117*  BUN 14 9  CREATININE 1.00 0.82  CALCIUM 9.3 9.1   ------------------------------------------------------------------------------------------------------------------ estimated creatinine clearance is 41.8 ml/min (by C-G formula based on Cr of 0.82). ------------------------------------------------------------------------------------------------------------------ No results found for this basename: HGBA1C,  in the last 72 hours ------------------------------------------------------------------------------------------------------------------ No results found for this basename: CHOL, HDL, LDLCALC, TRIG, CHOLHDL, LDLDIRECT,  in the last 72  hours ------------------------------------------------------------------------------------------------------------------ No results found for this basename: TSH, T4TOTAL, FREET3, T3FREE, THYROIDAB,  in the last 72 hours ------------------------------------------------------------------------------------------------------------------ No results found for this basename: VITAMINB12, FOLATE, FERRITIN, TIBC, IRON, RETICCTPCT,  in the last 72 hours  Coagulation profile  Recent Labs Lab 08/09/13 1125  INR 1.03    No results found for this basename: DDIMER,  in the last 72 hours  Cardiac Enzymes No results found for this basename: CK, CKMB, TROPONINI, MYOGLOBIN,  in the last 168 hours ------------------------------------------------------------------------------------------------------------------ No components found with this basename: POCBNP,      Time Spent in minutes  45   Viraaj Vorndran DO on 08/10/2013 at 9:27 AM  Between 7am to 7pm - Pager - [phone]  After 7pm go to [email or web address] - password  [email or web address]  And look for the night coverage person covering for me after hours  [facility] Office  [phone]

## 2013-08-10 NOTE — ED Provider Notes (Signed)
Medical screening examination/treatment/procedure(s) were performed by non-physician practitioner and as supervising physician I was immediately available for consultation/collaboration.   Camrie Stock, MD 08/10/13 0310 

## 2013-08-10 NOTE — Evaluation (Signed)
Physical Therapy Evaluation Patient Details Name: Gloria Martinez MRN: [id number] DOB: [birth year] Today's Date: 08/10/2013 Time: 5621-3086 PT Time Calculation (min): 28 min  PT Assessment / Plan / Recommendation History of Present Illness    79 yo assisted living pt staying at [facility] assisted living with fall and non displaced prox femur fracture at mid portion of stem   Clinical Impression  Pt admitted with L femur fx - non-operative at this time. Pt currently requiring assist x 2 for all mobility tasks progressing only to sitting at bedside x 10 minutes with assist to correct for Left post drift.  Pt will require follow up rehab in Snf setting    PT Assessment  Patient needs continued PT services    Follow Up Recommendations  SNF    Does the patient have the potential to tolerate intense rehabilitation      Barriers to Discharge        Equipment Recommendations  None recommended by PT    Recommendations for Other Services OT consult   Frequency Min 3X/week    Precautions / Restrictions Precautions Precautions: Fall Restrictions Weight Bearing Restrictions: Yes RLE Weight Bearing: Non weight bearing   Pertinent Vitals/Pain 4/10      Mobility  Bed Mobility Bed Mobility: Supine to Sit;Sit to Supine Supine to Sit: 1: +2 Total assist;HOB elevated Supine to Sit: Patient Percentage: 20% Sit to Supine: 1: +2 Total assist Sit to Supine: Patient Percentage: 10% Details for Bed Mobility Assistance: cues for use of UEs and L LE to self assist.  Physical assist to manage LEs, to bring trunk to upright and back to supine Transfers Transfers: Not assessed (did not progress to standing - poor sit balance and NWB) Ambulation/Gait Ambulation/Gait Assistance: Not tested (comment)    Exercises     PT Diagnosis: Difficulty walking  PT Problem List: Decreased strength;Decreased range of motion;Decreased activity tolerance;Decreased mobility;Decreased knowledge of use of  DME;Obesity;Pain PT Treatment Interventions: DME instruction;Gait training;Functional mobility training;Therapeutic activities;Therapeutic exercise;Patient/family education     PT Goals(Current goals can be found in the care plan section) Acute Rehab PT Goals Patient Stated Goal: Walk PT Goal Formulation: With patient Time For Goal Achievement: 08/24/13 Potential to Achieve Goals: Fair  Visit Information  Last PT Received On: 08/10/13 Assistance Needed: +2       Prior Functioning  Home Living Family/patient expects to be discharged to:: Skilled nursing facility Prior Function Level of Independence: Needs assistance Communication Communication: No difficulties Dominant Hand: Right    Cognition  Cognition Arousal/Alertness: Awake/alert Behavior During Therapy: WFL for tasks assessed/performed Overall Cognitive Status: Within Functional Limits for tasks assessed    Extremity/Trunk Assessment Upper Extremity Assessment Upper Extremity Assessment: Overall WFL for tasks assessed Lower Extremity Assessment Lower Extremity Assessment: RLE deficits/detail RLE: Unable to fully assess due to pain   Balance    End of Session PT - End of Session Equipment Utilized During Treatment: Gait belt Activity Tolerance: Patient tolerated treatment well Patient left: in bed;with call bell/phone within reach Nurse Communication: Mobility status  GP     Denys Labree 08/10/2013, 2:30 PM

## 2013-08-10 NOTE — Progress Notes (Signed)
Clinical Social Work Department CLINICAL SOCIAL WORK PLACEMENT NOTE 08/10/2013  Patient:  Gloria Martinez, Gloria Martinez  Account Number:  [id number] Admit date:  08/08/2013  Clinical Social Worker:  Cori Razor, LCSW  Date/time:  08/10/2013 12:43 PM  Clinical Social Work is seeking post-discharge placement for this patient at the following level of care:   SKILLED NURSING   (*CSW will update this form in Epic as items are completed)   08/10/2013  Patient/family provided with [facility] Department of Clinical Social Work's list of facilities offering this level of care within the geographic area requested by the patient (or if unable, by the patient's family).  08/10/2013  Patient/family informed of their freedom to choose among providers that offer the needed level of care, that participate in Medicare, Medicaid or managed care program needed by the patient, have an available bed and are willing to accept the patient.    Patient/family informed of [facility]' ownership interest in [facility], as well as of the fact that they are under no obligation to receive care at this facility.  PASARR submitted to EDS on  PASARR number received from EDS on   FL2 transmitted to all facilities in geographic area requested by pt/family on   FL2 transmitted to all facilities within larger geographic area on 03/04/2008  Patient informed that his/her managed care company has contracts with or will negotiate with  certain facilities, including the following:     Patient/family informed of bed offers received:  08/10/2013 Patient chooses bed at  Physician recommends and patient chooses bed at    Patient to be transferred to  on   Patient to be transferred to facility by   The following physician request were entered in Epic:   Additional Comments:  Cori Razor LCSW [phone]

## 2013-08-10 NOTE — Evaluation (Signed)
Clinical/Bedside Swallow Evaluation Patient Details  Name: Gloria Martinez MRN: [id number] Date of Birth: [birth year]  Today's Date: 08/10/2013 Time: 4098-1191 SLP Time Calculation (min): 8 min  Past Medical History:  Past Medical History  Diagnosis Date  . Hypertension   . Hypothyroidism   . Dementia   . Dysphagia   . Epilepsy   . Diverticulitis   . Muscle weakness   . Hyperlipidemia   . Anxiety   . Glaucoma    Past Surgical History:  Past Surgical History  Procedure Laterality Date  . Hip replacement  Right 2009   HPI:  79 y.o. female who presents to the ED after a fall sustaining a right nondisplaced femur fracture; no surgery required. Order for swallow eval reports hx dysphagia; no records available.   Assessment / Plan / Recommendation Clinical Impression  Pt presents with functional oropharyngeal swallow marked by active mastication, swift swallow response, and no s/s of aspiration despite large, successive sips of thin liquid using a straw.  Recommend advancing diet to regular consistency, thin liquids.  No SLP f/u required.      Aspiration Risk  Mild    Diet Recommendation Regular;Thin liquid   Liquid Administration via: Straw Medication Administration: Whole meds with liquid Supervision: Patient able to self feed    Other  Recommendations Oral Care Recommendations: Oral care BID   Follow Up Recommendations  None    SLP Swallow Goals n/a     Swallow Study Prior Functional Status       General Date of Onset: 08/09/13 HType of Study: Bedside swallow evaluation Previous Swallow Assessment: none per records Diet Prior to this Study: Dysphagia 1 (puree);Thin liquids Temperature Spikes Noted: No Respiratory Status: Nasal cannula History of Recent Intubation: No Behavior/Cognition: Alert;Cooperative Oral Cavity - Dentition: Adequate natural dentition Self-Feeding Abilities: Able to feed self Patient Positioning: Upright in bed Baseline Vocal Quality:  Clear Volitional Cough: Strong Volitional Swallow: Able to elicit    Oral/Motor/Sensory Function Overall Oral Motor/Sensory Function: Appears within functional limits for tasks assessed   Ice Chips Ice chips: Within functional limits Presentation: Spoon   Thin Liquid Thin Liquid: Within functional limits Presentation: Straw    Nectar Thick Nectar Thick Liquid: Not tested   Honey Thick Honey Thick Liquid: Not tested   Puree Puree: Within functional limits Presentation: Spoon   Solid   Glen Kesinger L. [place], [place] CCC/SLP Pager [phone]     Solid: Within functional limits       Blenda Mounts Laurice 08/10/2013,1:47 PM

## 2013-08-10 NOTE — Progress Notes (Signed)
INITIAL NUTRITION ASSESSMENT  DOCUMENTATION CODES Per approved criteria  -Not Applicable   INTERVENTION:  Continue regular diet as tolerated Ensure Complete po BID, each supplement provides 350 kcal and 13 grams of protein Encouraged intake RD to follow  NUTRITION DIAGNOSIS: Inadequate oral intake related to decreased appetite as evidenced by documented intake.   Goal: Intake of meals and supplements to meet >75% of estimated needs.  Monitor:  Intake, labs, weight  Reason for Assessment: Consult for assessment of needs and evaluation of status  79 y.o. female  Admitting Dx: <principal problem not specified>  ASSESSMENT: Patient s/p fall at ALF with resulting non displaced prox femur fracture at mid porion of stem.  Non operative at this time.  Pt s/p bedside swallow eval with recs for regular diet.  Intake is currently poor per chart.  Patient states that she is eating well but unable to answer my questions well and seems slightly confused.    Nutrition Focused Physical Exam:  Subcutaneous Fat:  Orbital Region: wnl Upper Arm Region: wnl Thoracic and Lumbar Region: n/a  Muscle:  Temple Region: wno Clavicle Bone Region: wnl Clavicle and Acromion Bone Region: wnl Scapular Bone Region: moderate Dorsal Hand: mild Patellar Region: mild Anterior Thigh Region: n/a Posterior Calf Region: n/a  Edema: none noted    Height: Ht Readings from Last 1 Encounters:  08/09/13 5\' 6"  (1.676 m)    Weight: Wt Readings from Last 1 Encounters:  08/09/13 143 lb 15.4 oz (65.3 kg)    Ideal Body Weight: 130 lbs  % Ideal Body Weight: 110  Wt Readings from Last 10 Encounters:  08/09/13 143 lb 15.4 oz (65.3 kg)    Usual Body Weight: unknown  % Usual Body Weight: unknown  BMI:  Body mass index is 23.25 kg/(m^2).  Estimated Nutritional Needs: Kcal: 1700-1800  Protein: 70-80 gm  Fluid: 1.8L daily  Skin: wnl  Diet Order: General  EDUCATION NEEDS: -No education needs  identified at this time   Intake/Output Summary (Last 24 hours) at 08/10/13 1620 Last data filed at 08/10/13 1300  Gross per 24 hour  Intake    120 ml  Output   1100 ml  Net   -980 ml    Labs:   Recent Labs Lab 08/09/13 0345 08/10/13 0539  NA 137 134*  K 4.0 3.3*  CL 99 97  CO2 24 26  BUN 14 9  CREATININE 1.00 0.82  CALCIUM 9.3 9.1  GLUCOSE 152* 117*    CBG (last 3)  No results found for this basename: GLUCAP,  in the last 72 hours  Scheduled Meds: . ALPRAZolam  1 mg Oral QHS  . amitriptyline  25 mg Oral QHS  . amLODipine  5 mg Oral Daily  . aspirin EC  81 mg Oral Daily  . donepezil  10 mg Oral QHS  . furosemide  20 mg Oral Daily  . latanoprost  1 drop Both Eyes QHS  . levothyroxine  88 mcg Oral QAC breakfast  . metoprolol succinate  100 mg Oral Daily  . pantoprazole  40 mg Oral Daily  . potassium chloride  20 mEq Oral Daily  . senna-docusate  1 tablet Oral BID  . simvastatin  10 mg Oral q1800  . Tdap  0.5 mL Intramuscular Once    Continuous Infusions:   Past Medical History  Diagnosis Date  . Hypertension   . Hypothyroidism   . Dementia   . Dysphagia   . Epilepsy   . Diverticulitis   .  Muscle weakness   . Hyperlipidemia   . Anxiety   . Glaucoma     Past Surgical History  Procedure Laterality Date  . Hip replacement  Right 2009    Oran Rein, RD, LDN Clinical Inpatient Dietitian Pager:  [phone] Weekend and after hours pager:  [phone]

## 2013-08-10 NOTE — Care Management Note (Signed)
    Page 1 of 1   08/10/2013     11:37:27 AM   CARE MANAGEMENT NOTE 08/10/2013  Patient:  Gloria Martinez, Gloria Martinez   Account Number:  [id number]  Date Initiated:  08/10/2013  Documentation initiated by:  Lorenda Ishihara  Subjective/Objective Assessment:   79 yo female admitted s/p fall with hip fracture. PTA lived at [facility].     Action/Plan:   SNF vs return to ALF   Anticipated DC Date:  08/13/2013   Anticipated DC Plan:  SKILLED NURSING FACILITY  In-house referral  Clinical Social Worker      DC Planning Services  CM consult      Choice offered to / List presented to:             Status of service:  Completed, signed off Medicare Important Message given?   (If response is "NO", the following Medicare IM given date fields will be blank) Date Medicare IM given:   Date Additional Medicare IM given:    Discharge Disposition:  HOME/SELF CARE  Per UR Regulation:  Reviewed for med. necessity/level of care/duration of stay  If discussed at Long Length of Stay Meetings, dates discussed:    Comments:

## 2013-08-10 NOTE — Progress Notes (Signed)
Patient ID: Gloria Martinez, female   DOB: Mar 14, 1920, 79 y.o.   MRN: [id number] Subjective:    Awake, alert and Oriented x 4. Moderate pain with movement.  Patient reports pain as moderate.    Objective:   VITALS:  Temp:  [97.2 F (36.2 C)-97.8 F (36.6 C)] 97.8 F (36.6 C) (12/26 1423) Pulse Rate:  [75-81] 81 (12/26 1423) Resp:  [14-16] 16 (12/26 1423) BP: (109-148)/(71-79) 109/71 mmHg (12/26 1423) SpO2:  [92 %-96 %] 92 % (12/26 1423)  Neurologically intact ABD soft Neurovascular intact Sensation intact distally Intact pulses distally Dorsiflexion/Plantar flexion intact non operative treatment of a non displaced periacetabular  periprosthetic fracture.   LABS  Recent Labs  08/09/13 0345 08/10/13 0539  HGB 13.9 13.3  WBC 12.2* 7.5  PLT 208 170    Recent Labs  08/09/13 0345 08/10/13 0539  NA 137 134*  K 4.0 3.3*  CL 99 97  CO2 24 26  BUN 14 9  CREATININE 1.00 0.82  GLUCOSE 152* 117*    Recent Labs  08/09/13 1125  INR 1.03     Assessment/Plan:    Periprosthetic periacetabular fracture nondisplaced, no loosening.  Advance diet Up with therapy Discharge to SNF  NITKA,JAMES E 08/10/2013, 7:09 PM

## 2013-08-11 LAB — CBC
HCT: 37.6 % (ref 36.0–46.0)
MCH: 32.4 pg (ref 26.0–34.0)
MCHC: 35.6 g/dL (ref 30.0–36.0)
RDW: 14.1 % (ref 11.5–15.5)

## 2013-08-11 LAB — BASIC METABOLIC PANEL
CO2: 24 mEq/L (ref 19–32)
Calcium: 9 mg/dL (ref 8.4–10.5)
Chloride: 98 mEq/L (ref 96–112)
Creatinine, Ser: 0.94 mg/dL (ref 0.50–1.10)
Glucose, Bld: 116 mg/dL — ABNORMAL HIGH (ref 70–99)
Potassium: 3.5 mEq/L (ref 3.5–5.1)
Sodium: 134 mEq/L — ABNORMAL LOW (ref 135–145)

## 2013-08-11 NOTE — Progress Notes (Signed)
[facility]                                                                                Patient Demographics  Gloria Martinez, is a 79 y.o. female, DOB - [birth year], ZOX:[id number]  Admit date - 08/08/2013   Admitting Physician Hillary Bow, DO  Outpatient Primary MD for the patient is Lauro Regulus., MD  LOS - 3   Chief Complaint  Patient presents with  . Fall        Assessment & Plan      Right hip pain and discomfort secondary to partial incomplete fibular diaphysis fracture. She is a previous hip prosthesis on the right side.- Orthopedic physician Dr. August Saucer saw the patient in the ER and requested that patient be seen this morning by his partner Dr. Ophelia Charter If pt avoids weight bearing for 2 wks and does transfers without falling she will not require surgery Social worker for SNF placement   Hypertension. Continue Toprol-XL   Hypothyroidism. Continue Synthroid at home dose.   Dyslipidemia continue statin at home dose.  Hyponatremia -monitor  hypokalemia -replace  Code Status: Full  Family Communication:  None at bedside  Disposition Plan: SNF   Procedures CT scan of the hip   Consults  Ortho physicians Dr. August Saucer and Ophelia Charter   Medications  Scheduled Meds: . ALPRAZolam  1 mg Oral QHS  . amitriptyline  25 mg Oral QHS  . amLODipine  5 mg Oral Daily  . aspirin EC  81 mg Oral Daily  . donepezil  10 mg Oral QHS  . feeding supplement (ENSURE COMPLETE)  237 mL Oral BID BM  . furosemide  20 mg Oral Daily  . latanoprost  1 drop Both Eyes QHS  . levothyroxine  88 mcg Oral QAC breakfast  . metoprolol succinate  100 mg Oral Daily  . pantoprazole  40 mg Oral Daily  . potassium chloride  20 mEq Oral Daily  . senna-docusate  1 tablet Oral BID  . simvastatin  10 mg Oral q1800  . Tdap  0.5 mL Intramuscular Once   Continuous Infusions:   PRN Meds:.fentaNYL, metoprolol, ondansetron (ZOFRAN) IV  DVT Prophylaxis  heparin  Lab Results  Component  Value Date   PLT 166 08/11/2013    Antibiotics    Anti-infectives   None          Subjective:   [name]   Is doing well, no new c/o    Objective:   Filed Vitals:   08/10/13 1423 08/10/13 2200 08/11/13 0600 08/11/13 0800  BP: 109/71 157/83 137/81   Pulse: 81 75 79   Temp: 97.8 F (36.6 C) 98.4 F (36.9 C) 97.3 F (36.3 C)   TempSrc:  Oral Oral   Resp: 16 17 18 18   Height:      Weight:      SpO2: 92% 96% 95% 96%    Wt Readings from Last 3 Encounters:  08/09/13 65.3 kg (143 lb 15.4 oz)     Intake/Output Summary (Last 24 hours) at 08/11/13 1034 Last data filed at 08/11/13 1019  Gross per 24 hour  Intake    480 ml  Output  600 ml  Net   -120 ml    Exam Awake , Oriented X 1 Normal affect- pleasant Symmetrical Chest wall movement, Good air movement bilaterally, CTAB RRR,No Gallops,Rubs or new Murmurs +ve B.Sounds, Abd Soft, Non tender, No organomegaly appriciated, No rebound - guarding or rigidity. No Cyanosis, Clubbing or edema, No new Rash or bruise, right leg appears to be internally rotated    Data Review   Micro Results No results found for this or any previous visit (from the past 240 hour(s)).  Radiology Reports Dg Hip Complete Right  08/09/2013   CLINICAL DATA:  Fall.  Right lateral hip pain.  EXAM: RIGHT HIP - COMPLETE 2+ VIEW  COMPARISON:  None.  FINDINGS: Right total hip arthroplasty is present. Femoral stem appears within normal limits. There is no fracture identified. Small amount of lucency around the superior margin of the right acetabular cup could represent early loosening. There is no periprosthetic fracture identified.  IMPRESSION: No acute osseous abnormality.   Electronically Signed   By: Andreas Newport M.D.   On: 08/09/2013 01:00   Ct Head Wo Contrast  08/09/2013   CLINICAL DATA:  Fall.  Dementia.  Head trauma.  Neck pain.  EXAM: CT HEAD WITHOUT CONTRAST  CT CERVICAL SPINE WITHOUT CONTRAST  TECHNIQUE: Multidetector CT imaging  of the head and cervical spine was performed following the standard protocol without intravenous contrast. Multiplanar CT image reconstructions of the cervical spine were also generated.  COMPARISON:  None.  FINDINGS: CT HEAD FINDINGS  No mass lesion, mass effect, midline shift, hydrocephalus, hemorrhage. No acute territorial cortical ischemia/infarct. Atrophy and chronic ischemic white matter disease is present. Calvarium is intact. Intracranial atherosclerosis.  CT CERVICAL SPINE FINDINGS  There is a levoconvex torticollis. Motion artifact is present on the examination. Multilevel cervical spondylosis and facet arthrosis. Odontoid and occipital condyles appear intact. In the posterior fossa, vertebral basilar dolichoectasia is partially visualized. Lung apices appear within normal limits. Osteopenia. There is no cervical spine fracture or dislocation. Anterolisthesis of C3 on C4 and C4 on C5 appears degenerative. This measures about 2-3 mm at both levels. Multilevel disc osteophyte complexes are present. Carotid atherosclerosis incidentally noted.  IMPRESSION: 1. Atrophy and chronic ischemic white matter disease without acute intracranial abnormality. 2. Cervical spondylosis and facet arthrosis without acute osseous abnormality.   Electronically Signed   By: Andreas Newport M.D.   On: 08/09/2013 01:10   Ct Cervical Spine Wo Contrast  08/09/2013   CLINICAL DATA:  Fall.  Dementia.  Head trauma.  Neck pain.  EXAM: CT HEAD WITHOUT CONTRAST  CT CERVICAL SPINE WITHOUT CONTRAST  TECHNIQUE: Multidetector CT imaging of the head and cervical spine was performed following the standard protocol without intravenous contrast. Multiplanar CT image reconstructions of the cervical spine were also generated.  COMPARISON:  None.  FINDINGS: CT HEAD FINDINGS  No mass lesion, mass effect, midline shift, hydrocephalus, hemorrhage. No acute territorial cortical ischemia/infarct. Atrophy and chronic ischemic white matter disease is  present. Calvarium is intact. Intracranial atherosclerosis.  CT CERVICAL SPINE FINDINGS  There is a levoconvex torticollis. Motion artifact is present on the examination. Multilevel cervical spondylosis and facet arthrosis. Odontoid and occipital condyles appear intact. In the posterior fossa, vertebral basilar dolichoectasia is partially visualized. Lung apices appear within normal limits. Osteopenia. There is no cervical spine fracture or dislocation. Anterolisthesis of C3 on C4 and C4 on C5 appears degenerative. This measures about 2-3 mm at both levels. Multilevel disc osteophyte complexes are present. Carotid atherosclerosis  incidentally noted.  IMPRESSION: 1. Atrophy and chronic ischemic white matter disease without acute intracranial abnormality. 2. Cervical spondylosis and facet arthrosis without acute osseous abnormality.   Electronically Signed   By: Andreas Newport M.D.   On: 08/09/2013 01:10   Ct Hip Right Wo Contrast  08/09/2013   CLINICAL DATA:  Right hip pain.  EXAM: CT OF THE RIGHT HIP WITHOUT CONTRAST  TECHNIQUE: Multidetector CT imaging was performed according to the standard protocol. Multiplanar CT image reconstructions were also generated.  COMPARISON:  Right hip radiographs performed earlier today at 12:50 a.m.  FINDINGS: There appears to be an incomplete oblique fracture line extending across the lateral cortex of the proximal fibular diaphysis, extending likely to the prosthesis. The patient's right hip prosthesis is otherwise unremarkable in appearance. There is no evidence of loosening. The prosthesis is noted in expected alignment. The right superior and inferior pubic rami are unremarkable in appearance.  Relatively diffuse calcification is noted along the right common and superficial femoral artery. Mild diverticulosis is noted along the visualized sigmoid colon, without evidence for diverticulitis. The bladder is grossly unremarkable in appearance, though not well assessed due to  metal artifact. There is no evidence of significant soft tissue injury.  IMPRESSION: 1. Incomplete oblique fracture line noted extending across the lateral cortex of the proximal fibular diaphysis, likely extending to the prosthesis. Given the incomplete nature of the fracture line, there is no evidence of displacement. 2. Right hip prosthesis is otherwise unremarkable in appearance, without evidence of loosening. 3. Relatively diffuse calcification along the right common and superficial femoral artery. 4. Mild diverticulosis along the visualized sigmoid colon, without evidence of diverticulitis.   Electronically Signed   By: Roanna Raider M.D.   On: 08/09/2013 03:00    CBC  Recent Labs Lab 08/09/13 0345 08/10/13 0539 08/11/13 0431  WBC 12.2* 7.5 6.7  HGB 13.9 13.3 13.4  HCT 40.8 38.5 37.6  PLT 208 170 166  MCV 90.7 90.2 90.8  MCH 30.9 31.1 32.4  MCHC 34.1 34.5 35.6  RDW 13.7 13.8 14.1  LYMPHSABS 1.3  --   --   MONOABS 1.0  --   --   EOSABS 0.1  --   --   BASOSABS 0.0  --   --     Chemistries   Recent Labs Lab 08/09/13 0345 08/10/13 0539 08/11/13 0431  NA 137 134* 134*  K 4.0 3.3* 3.5  CL 99 97 98  CO2 24 26 24   GLUCOSE 152* 117* 116*  BUN 14 9 16   CREATININE 1.00 0.82 0.94  CALCIUM 9.3 9.1 9.0   ------------------------------------------------------------------------------------------------------------------ estimated creatinine clearance is 36.5 ml/min (by C-G formula based on Cr of 0.94). ------------------------------------------------------------------------------------------------------------------ No results found for this basename: HGBA1C,  in the last 72 hours ------------------------------------------------------------------------------------------------------------------ No results found for this basename: CHOL, HDL, LDLCALC, TRIG, CHOLHDL, LDLDIRECT,  in the last 72  hours ------------------------------------------------------------------------------------------------------------------ No results found for this basename: TSH, T4TOTAL, FREET3, T3FREE, THYROIDAB,  in the last 72 hours ------------------------------------------------------------------------------------------------------------------ No results found for this basename: VITAMINB12, FOLATE, FERRITIN, TIBC, IRON, RETICCTPCT,  in the last 72 hours  Coagulation profile  Recent Labs Lab 08/09/13 1125  INR 1.03    No results found for this basename: DDIMER,  in the last 72 hours  Cardiac Enzymes No results found for this basename: CK, CKMB, TROPONINI, MYOGLOBIN,  in the last 168 hours ------------------------------------------------------------------------------------------------------------------ No components found with this basename: POCBNP,      Time Spent in minutes  25  VANN, JESSICA DO on 08/11/2013 at 10:34 AM  Between 7am to 7pm - Pager - [phone]  After 7pm go to [email or web address] - password [email or web address]  And look for the night coverage person covering for me after hours  [facility] Office  [phone]

## 2013-08-12 MED ORDER — ENOXAPARIN SODIUM 40 MG/0.4ML ~~LOC~~ SOLN
40.0000 mg | SUBCUTANEOUS | Status: DC
  Administered 2013-08-12 – 2013-08-13 (×2): 40 mg via SUBCUTANEOUS
  Filled 2013-08-12 (×2): qty 0.4

## 2013-08-12 NOTE — Progress Notes (Addendum)
Progress Note                                                                               Gloria Martinez, is a 79 y.o. female, DOB - 10/19/1919, ZOX:[id number]  Assessment & Plan  Right hip pain and discomfort secondary to periprosthetic periacetabular fracture nondisplaced - She has a previous hip prosthesis on the right side.  - Orthopedic surgery has been consulted, appreciate input.  - If pt avoids weight bearing for 2 wks and does transfers without falling she will not require surgery - Social worker for SNF placement Hypertension. - Continue Toprol-XL Hypothyroidism. - Continue Synthroid at home dose. Dyslipidemia - continue statin at home dose. Hyponatremia - monitor Hypokalemia - replace  Code Status: Full Family Communication:  None at bedside Disposition Plan: SNF  Procedures CT scan of the hip Consults  Ortho physicians Dr. August Saucer and Ophelia Charter  Scheduled Meds: . ALPRAZolam  1 mg Oral QHS  . amitriptyline  25 mg Oral QHS  . amLODipine  5 mg Oral Daily  . aspirin EC  81 mg Oral Daily  . donepezil  10 mg Oral QHS  . feeding supplement (ENSURE COMPLETE)  237 mL Oral BID BM  . furosemide  20 mg Oral Daily  . latanoprost  1 drop Both Eyes QHS  . levothyroxine  88 mcg Oral QAC breakfast  . metoprolol succinate  100 mg Oral Daily  . pantoprazole  40 mg Oral Daily  . potassium chloride  20 mEq Oral Daily  . senna-docusate  1 tablet Oral BID  . simvastatin  10 mg Oral q1800  . Tdap  0.5 mL Intramuscular Once   Continuous Infusions:   PRN Meds:.fentaNYL, metoprolol, ondansetron (ZOFRAN) IV  DVT Prophylaxis  heparin  Lab Results  Component Value Date   PLT 166 08/11/2013   Anti-infectives   None     Subjective:  - no specific complaints, pain controlled, feels sleepy.    Objective:   Filed Vitals:   08/11/13 1548 08/11/13 2055 08/12/13 0650 08/12/13 0800  BP:  148/79 128/62   Pulse:  79 91   Temp:  98.9 F (37.2 C) 98.5 F (36.9 C)   TempSrc:  Oral Oral     Resp: 16 16 20 18   Height:      Weight:      SpO2: 100% 94% 90% 96%    Wt Readings from Last 3 Encounters:  08/09/13 65.3 kg (143 lb 15.4 oz)     Intake/Output Summary (Last 24 hours) at 08/12/13 1014 Last data filed at 08/12/13 0650  Gross per 24 hour  Intake    377 ml  Output    200 ml  Net    177 ml    Exam Awake , Oriented X 1 Normal affect- pleasant Symmetrical Chest wall movement, Good air movement bilaterally, CTAB RRR,No Gallops,Rubs or new Murmurs +ve B.Sounds, Abd Soft, Non tender, No organomegaly appriciated, No rebound - guarding or rigidity. No Cyanosis, Clubbing or edema, No new Rash or bruise, right leg appears to be internally rotated   Radiology Reports Dg Hip Complete Right  08/09/2013   CLINICAL DATA:  Fall.  Right lateral hip pain.  EXAM: RIGHT HIP - COMPLETE 2+ VIEW  COMPARISON:  None.  FINDINGS: Right total hip arthroplasty is present. Femoral stem appears within normal limits. There is no fracture identified. Small amount of lucency around the superior margin of the right acetabular cup could represent early loosening. There is no periprosthetic fracture identified.  IMPRESSION: No acute osseous abnormality.   Electronically Signed   By: Andreas Newport M.D.   On: 08/09/2013 01:00   Ct Head Wo Contrast  08/09/2013   CLINICAL DATA:  Fall.  Dementia.  Head trauma.  Neck pain.  EXAM: CT HEAD WITHOUT CONTRAST  CT CERVICAL SPINE WITHOUT CONTRAST  TECHNIQUE: Multidetector CT imaging of the head and cervical spine was performed following the standard protocol without intravenous contrast. Multiplanar CT image reconstructions of the cervical spine were also generated.  COMPARISON:  None.  FINDINGS: CT HEAD FINDINGS  No mass lesion, mass effect, midline shift, hydrocephalus, hemorrhage. No acute territorial cortical ischemia/infarct. Atrophy and chronic ischemic white matter disease is present. Calvarium is intact. Intracranial atherosclerosis.  CT CERVICAL SPINE  FINDINGS  There is a levoconvex torticollis. Motion artifact is present on the examination. Multilevel cervical spondylosis and facet arthrosis. Odontoid and occipital condyles appear intact. In the posterior fossa, vertebral basilar dolichoectasia is partially visualized. Lung apices appear within normal limits. Osteopenia. There is no cervical spine fracture or dislocation. Anterolisthesis of C3 on C4 and C4 on C5 appears degenerative. This measures about 2-3 mm at both levels. Multilevel disc osteophyte complexes are present. Carotid atherosclerosis incidentally noted.  IMPRESSION: 1. Atrophy and chronic ischemic white matter disease without acute intracranial abnormality. 2. Cervical spondylosis and facet arthrosis without acute osseous abnormality.   Electronically Signed   By: Andreas Newport M.D.   On: 08/09/2013 01:10   Ct Cervical Spine Wo Contrast  08/09/2013   CLINICAL DATA:  Fall.  Dementia.  Head trauma.  Neck pain.  EXAM: CT HEAD WITHOUT CONTRAST  CT CERVICAL SPINE WITHOUT CONTRAST  TECHNIQUE: Multidetector CT imaging of the head and cervical spine was performed following the standard protocol without intravenous contrast. Multiplanar CT image reconstructions of the cervical spine were also generated.  COMPARISON:  None.  FINDINGS: CT HEAD FINDINGS  No mass lesion, mass effect, midline shift, hydrocephalus, hemorrhage. No acute territorial cortical ischemia/infarct. Atrophy and chronic ischemic white matter disease is present. Calvarium is intact. Intracranial atherosclerosis.  CT CERVICAL SPINE FINDINGS  There is a levoconvex torticollis. Motion artifact is present on the examination. Multilevel cervical spondylosis and facet arthrosis. Odontoid and occipital condyles appear intact. In the posterior fossa, vertebral basilar dolichoectasia is partially visualized. Lung apices appear within normal limits. Osteopenia. There is no cervical spine fracture or dislocation. Anterolisthesis of C3 on C4 and  C4 on C5 appears degenerative. This measures about 2-3 mm at both levels. Multilevel disc osteophyte complexes are present. Carotid atherosclerosis incidentally noted.  IMPRESSION: 1. Atrophy and chronic ischemic white matter disease without acute intracranial abnormality. 2. Cervical spondylosis and facet arthrosis without acute osseous abnormality.   Electronically Signed   By: Andreas Newport M.D.   On: 08/09/2013 01:10   Ct Hip Right Wo Contrast  08/09/2013   CLINICAL DATA:  Right hip pain.  EXAM: CT OF THE RIGHT HIP WITHOUT CONTRAST  TECHNIQUE: Multidetector CT imaging was performed according to the standard protocol. Multiplanar CT image reconstructions were also generated.  COMPARISON:  Right hip radiographs performed earlier today at 12:50 a.m.  FINDINGS: There appears to be an incomplete oblique fracture line  extending across the lateral cortex of the proximal fibular diaphysis, extending likely to the prosthesis. The patient's right hip prosthesis is otherwise unremarkable in appearance. There is no evidence of loosening. The prosthesis is noted in expected alignment. The right superior and inferior pubic rami are unremarkable in appearance.  Relatively diffuse calcification is noted along the right common and superficial femoral artery. Mild diverticulosis is noted along the visualized sigmoid colon, without evidence for diverticulitis. The bladder is grossly unremarkable in appearance, though not well assessed due to metal artifact. There is no evidence of significant soft tissue injury.  IMPRESSION: 1. Incomplete oblique fracture line noted extending across the lateral cortex of the proximal fibular diaphysis, likely extending to the prosthesis. Given the incomplete nature of the fracture line, there is no evidence of displacement. 2. Right hip prosthesis is otherwise unremarkable in appearance, without evidence of loosening. 3. Relatively diffuse calcification along the right common and superficial  femoral artery. 4. Mild diverticulosis along the visualized sigmoid colon, without evidence of diverticulitis.   Electronically Signed   By: Roanna Raider M.D.   On: 08/09/2013 03:00   CBC  Recent Labs Lab 08/09/13 0345 08/10/13 0539 08/11/13 0431  WBC 12.2* 7.5 6.7  HGB 13.9 13.3 13.4  HCT 40.8 38.5 37.6  PLT 208 170 166  MCV 90.7 90.2 90.8  MCH 30.9 31.1 32.4  MCHC 34.1 34.5 35.6  RDW 13.7 13.8 14.1  LYMPHSABS 1.3  --   --   MONOABS 1.0  --   --   EOSABS 0.1  --   --   BASOSABS 0.0  --   --     Chemistries   Recent Labs Lab 08/09/13 0345 08/10/13 0539 08/11/13 0431  NA 137 134* 134*  K 4.0 3.3* 3.5  CL 99 97 98  CO2 24 26 24   GLUCOSE 152* 117* 116*  BUN 14 9 16   CREATININE 1.00 0.82 0.94  CALCIUM 9.3 9.1 9.0   Coagulation profile  Recent Labs Lab 08/09/13 1125  INR 1.03   Time spent: 25 min  Winna Golla M. Elvera Lennox, MD [facility] [phone] After 7pm go to [email or web address] - password [facility]

## 2013-08-13 LAB — TYPE AND SCREEN
ABO/RH(D): A POS
Antibody Screen: NEGATIVE
Unit division: 0

## 2013-08-13 MED ORDER — INFLUENZA VAC SPLIT QUAD 0.5 ML IM SUSP
0.5000 mL | INTRAMUSCULAR | Status: AC
  Administered 2013-08-13: 0.5 mL via INTRAMUSCULAR
  Filled 2013-08-13: qty 0.5

## 2013-08-13 NOTE — Discharge Summary (Signed)
Physician Discharge Summary  Gloria Martinez WUJ:[id number] DOB: 02-13-1920 DOA: 08/08/2013  PCP: Lauro Regulus., MD  Admit date: 08/08/2013 Discharge date: 08/13/2013  Recommendations for Outpatient Follow-up:  1. Pt will need to follow up with PCP in 2-3 weeks post discharge 2. Please obtain BMP to evaluate electrolytes and kidney function 3. Please also check CBC to evaluate Hg and Hct levels 4. Pt will be discharged to SNF, [facility]   Discharge Diagnoses:  Active Problems:   Hip fracture  Discharge Condition: Stable  Diet recommendation: Heart healthy diet discussed in details   History of present illness:  79 y.o. female who presented to the ED after a fall. Fall was initially reported to have occurred earlier prior to the admission. Patient apparently was trying to crush a bug on the ground when she lost her balance and fell. She has had severe R hip pain with weight bearing ever since the fall and so she presented to the ED.  Hospital Course:  Active Problems:   Hip fracture  Right hip pain and discomfort secondary to periprosthetic periacetabular fracture nondisplaced  - She has a previous hip prosthesis on the right side.  - Orthopedic surgery has been consulted, appreciate input.  - If pt avoids weight bearing for 2 wks and does transfers without falling she will not require surgery  - SNF placement recommended, ortho team cleared for discharge to SNF Hypertension. - Continue Toprol-XL  Hypothyroidism. - Continue Synthroid at home dose.  Dyslipidemia - continue statin at home dose.  Hyponatremia - stable, encouraged PO intake  Hypokalemia - stable and WNL  Procedures/Studies:  Dg Hip Complete Right   08/09/2013   No acute osseous abnormality.    Ct Cervical Spine Wo Contrast   08/09/2013   Atrophy and chronic ischemic white matter disease without acute intracranial abnormality. Cervical spondylosis and facet arthrosis without acute osseous abnormality.    Ct  Hip Right Wo Contrast   08/09/2013  Incomplete oblique fx extending across the lateral cortex of proximal fibular diaphysis, lextending to prosthesis. N evidence of displacement. Right hip prosthesis is otherwise unremarkable in appearance, without evidence of loosening. Relatively diffuse calcification along the right common and superficial femoral artery. Mild diverticulosis along the visualized sigmoid colon, without evidence of diverticulitis.    Consultations:  Ortho  Antibiotics:  None  Discharge Exam: Filed Vitals:   08/13/13 1018  BP: 128/81  Pulse:   Temp:   Resp:    Filed Vitals:   08/13/13 0610 08/13/13 1007 08/13/13 1016 08/13/13 1018  BP: 135/65 128/81 128/81 128/81  Pulse: 68 67 67   Temp: 97.9 F (36.6 C) 98.2 F (36.8 C)    TempSrc: Oral Oral    Resp: 16 20    Height:      Weight:      SpO2: 96% 96%      General: Pt is alert, follows commands appropriately, not in acute distress Cardiovascular: Regular rate and rhythm, S1/S2 +, no murmurs, no rubs, no gallops Respiratory: Clear to auscultation bilaterally, no wheezing, no crackles, no rhonchi Abdominal: Soft, non tender, non distended, bowel sounds +, no guarding Neuro: Grossly nonfocal  Discharge Instructions  Discharge Orders   Future Orders Complete By Expires   Diet - low sodium heart healthy  As directed    Increase activity slowly  As directed        Medication List    STOP taking these medications       ALPRAZolam 0.5 MG tablet  Commonly  known as:  XANAX      TAKE these medications       amitriptyline 25 MG tablet  Commonly known as:  ELAVIL  Take 25 mg by mouth at bedtime.     amLODipine 5 MG tablet  Commonly known as:  NORVASC  Take 5 mg by mouth daily.     aspirin EC 81 MG tablet  Take 81 mg by mouth daily.     donepezil 5 MG tablet  Commonly known as:  ARICEPT  Take 10 mg by mouth at bedtime.     furosemide 20 MG tablet  Commonly known as:  LASIX  Take 20 mg by mouth  daily.     latanoprost 0.005 % ophthalmic solution  Commonly known as:  XALATAN  Place 1 drop into both eyes at bedtime.     levothyroxine 88 MCG tablet  Commonly known as:  SYNTHROID, LEVOTHROID  Take 88 mcg by mouth daily before breakfast.     lovastatin 20 MG tablet  Commonly known as:  MEVACOR  Take 40 mg by mouth at bedtime.     metoprolol succinate 50 MG 24 hr tablet  Commonly known as:  TOPROL-XL  Take 100 mg by mouth daily. Take with or immediately following a meal.     omeprazole 20 MG capsule  Commonly known as:  PRILOSEC  Take 20 mg by mouth daily.     potassium chloride 20 MEQ/15ML (10%) solution  Take 10 mEq by mouth daily.     senna-docusate 8.6-50 MG per tablet  Commonly known as:  Senokot-S  Take 1 tablet by mouth 2 (two) times daily.           Follow-up Information   Follow up with Lauro Regulus., MD In 2 weeks.   Specialty:  Internal Medicine   Contact information:   [facility] [address] [place] [address] [phone]       Follow up with Debbora Presto, MD. (As needed if symptoms worsen)    Specialty:  Internal Medicine   Contact information:   [address] [place] [address] [phone]        The results of significant diagnostics from this hospitalization (including imaging, microbiology, ancillary and laboratory) are listed below for reference.     Microbiology: No results found for this or any previous visit (from the past 240 hour(s)).   Labs: Basic Metabolic Panel:  Recent Labs Lab 08/09/13 0345 08/10/13 0539 08/11/13 0431  NA 137 134* 134*  K 4.0 3.3* 3.5  CL 99 97 98  CO2 24 26 24   GLUCOSE 152* 117* 116*  BUN 14 9 16   CREATININE 1.00 0.82 0.94  CALCIUM 9.3 9.1 9.0   CBC:  Recent Labs Lab 08/09/13 0345 08/10/13 0539 08/11/13 0431  WBC 12.2* 7.5 6.7  NEUTROABS 9.8*  --   --   HGB 13.9 13.3 13.4  HCT 40.8 38.5 37.6  MCV 90.7 90.2 90.8  PLT 208 170 166    SIGNED: Time coordinating discharge: Over 30 minutes  Debbora Presto, MD  [facility] 08/13/2013, 10:52 AM Pager [phone]  If 7PM-7AM, please contact night-coverage [email or web address] Password [email or web address]

## 2013-08-13 NOTE — Progress Notes (Signed)
Patient transferred to [facility] via ambulance service with belongings. Alert and verbal with eyeglasses on. Patient requested flu shot be given--done. Antoinette called at [facility] to be notified flu shot given.

## 2013-08-13 NOTE — Progress Notes (Signed)
Patient dressed and ready for transfer to [facility] facility for rehab post fall. Belongings packed. Pain denied. Non-weightbearing to rle. Report called to accepting nurse Antoinette at facility.

## 2013-08-13 NOTE — Progress Notes (Signed)
Clinical Social Work Department CLINICAL SOCIAL WORK PLACEMENT NOTE 08/13/2013  Patient:  Gloria Martinez, Gloria Martinez  Account Number:  [id number] Admit date:  08/08/2013  Clinical Social Worker:  Cori Razor, LCSW  Date/time:  08/10/2013 12:43 PM  Clinical Social Work is seeking post-discharge placement for this patient at the following level of care:   SKILLED NURSING   (*CSW will update this form in Epic as items are completed)   08/10/2013  Patient/family provided with [facility] Department of Clinical Social Work's list of facilities offering this level of care within the geographic area requested by the patient (or if unable, by the patient's family).  08/10/2013  Patient/family informed of their freedom to choose among providers that offer the needed level of care, that participate in Medicare, Medicaid or managed care program needed by the patient, have an available bed and are willing to accept the patient.    Patient/family informed of [facility]' ownership interest in [facility], as well as of the fact that they are under no obligation to receive care at this facility.  PASARR submitted to EDS on  PASARR number received from EDS on   FL2 transmitted to all facilities in geographic area requested by pt/family on   FL2 transmitted to all facilities within larger geographic area on 03/04/2008  Patient informed that his/her managed care company has contracts with or will negotiate with  certain facilities, including the following:     Patient/family informed of bed offers received:  08/10/2013 Patient chooses bed at [facility] Physician recommends and patient chooses bed at    Patient to be transferred to [facility] on  08/13/2013 Patient to be transferred to facility by [facility]  The following physician request were entered in Epic:   Additional Comments:  Cori Razor LCSW [phone]

## 2014-12-06 NOTE — Discharge Summary (Signed)
PATIENT NAME:  Gloria Martinez, Gloria Martinez MR#:  [id number] DATE OF BIRTH:  Jul 10, 1920  DATE OF ADMISSION:  01/07/2013 DATE OF DISCHARGE:  01/11/2013  DISCHARGE DIAGNOSES: 1. Acute diverticulitis. 2.  Hypertension.  3.  Hyperlipidemia.  4.  Hypothyroidism.   DISCHARGE MEDICATIONS: 1.  Toprol-XL 100 mg daily.  2.  Norvasc 5 mg daily.  3.  Levoxyl 88 mcg daily.  4.  Norco 5/325 mg 1 q. 4 hours p.r.n. pain.  5.  Xanax 0.5 mg at bedtime.  6.  Amitriptyline 25 mg at bedtime.  7.  Lasix 20 mg daily. 8.  Lovastatin 40 mg at bedtime.  9.  Potassium chloride 10 mEq daily.  10.  Flagyl 500 mg t.i.d. 11.  Aspirin 81 mg daily.  12.  Colace b.i.d.  13.  Protonix 40 mg b.i.d.  14.  Doxycycline 100 mg q. 12 hours.   REASON FOR ADMISSION: A 81 year old female who presents with severe abdominal pain. Please see H and P for HPI, past medical history and physical exam.   HOSPITAL COURSE: The patient was admitted.  Had been on antibiotics. White count 25,000. CT showed significant descending colon diverticulitis. She was switched to Flagyl and aztreonam.  Her symptoms improved dramatically over the hospital course and was discharged on Flagyl and doxy. Her appetite improved. She was still fairly weak and will be going to skilled nursing. Overall prognosis is guarded.  ____________________________ Danella PentonMark F. Ariday Brinker, MD mfm:sb D: 01/19/2013 07:58:03 ET T: 01/19/2013 09:30:49 ET JOB#: [id number]  cc: Danella PentonMark F. Bowden Boody, MD, <Dictator> Julius Boniface Sherlene ShamsF Acsa Estey MD ELECTRONICALLY SIGNED 01/22/2013 8:16

## 2014-12-06 NOTE — Consult Note (Signed)
Chief Complaint:  Subjective/Chief Complaint seen for lower abdominal pain.  feeling some better today.  no n/v, less abdominal pain.   VITAL SIGNS/ANCILLARY NOTES: **Vital Signs.:   26-May-14 06:03  Vital Signs Type Routine  Temperature Temperature (F) 97.7  Celsius 36.5  Temperature Source oral  Pulse Pulse 80  Respirations Respirations 18  Systolic BP Systolic BP 409  Diastolic BP (mmHg) Diastolic BP (mmHg) 73  Mean BP 90  Pulse Ox % Pulse Ox % 91  Pulse Ox Activity Level  At rest  Oxygen Delivery Room Air/ 21 %   Brief Assessment:  Cardiac Regular   Respiratory clear BS   Gastrointestinal details normal Soft  Nondistended  No masses palpable  Bowel sounds normal  No rebound tenderness  marked tenderness to palpation in the llq.   Lab Results: Thyroid:  26-May-14 04:21   Thyroid Stimulating Hormone 0.968 (0.45-4.50 (International Unit)  ----------------------- Pregnant patients have  different reference  ranges for TSH:  - - - - - - - - - -  Pregnant, first trimetser:  0.36 - 2.50 uIU/mL)  Routine Micro:  25-May-14 05:15   Micro Text Report BLOOD CULTURE   COMMENT                   NO GROWTH IN 18-24 HOURS   ANTIBIOTIC                       Culture Comment NO GROWTH IN 18-24 HOURS  Result(s) reported on 08 Jan 2013 at 06:23AM.  Routine Chem:  26-May-14 04:21   Glucose, Serum  104  BUN 15  Creatinine (comp) 0.81  Sodium, Serum 136  Potassium, Serum 3.9  Chloride, Serum 104  CO2, Serum 26  Calcium (Total), Serum 8.7  Anion Gap  6  Osmolality (calc) 273  eGFR (African American) >60  eGFR (Non-African American) >60 (eGFR values <10mL/min/1.73 m2 may be an indication of chronic kidney disease (CKD). Calculated eGFR is useful in patients with stable renal function. The eGFR calculation will not be reliable in acutely ill patients when serum creatinine is changing rapidly. It is not useful in  patients on dialysis. The eGFR calculation may not be  applicable to patients at the low and high extremes of body sizes, pregnant women, and vegetarians.)  Routine Hem:  24-May-14 19:14   WBC (CBC)  25.6  Hemoglobin (CBC) 15.7  25-May-14 04:00   WBC (CBC)  25.8  Hemoglobin (CBC) 15.3  26-May-14 04:21   WBC (CBC)  17.6  RBC (CBC) 4.38  Hemoglobin (CBC) 13.6  Hematocrit (CBC) 39.7  Platelet Count (CBC) 241  MCV 91  MCH 31.1  MCHC 34.3  RDW 14.1  Neutrophil % 81.0  Lymphocyte % 9.1  Monocyte % 9.5  Eosinophil % 0.2  Basophil % 0.2  Neutrophil #  14.2  Lymphocyte # 1.6  Monocyte #  1.7  Eosinophil # 0.0  Basophil # 0.0 (Result(s) reported on 08 Jan 2013 at 05:46AM.)   Radiology Results: CT:    26-May-14 10:08, CT Abdomen and Pelvis With Contrast  CT Abdomen and Pelvis With Contrast   REASON FOR EXAM:    (1) lower left contrast; (2) lower left quadrant   pain;    NOTE: Nursing to Give  COMMENTS:   LMP: Post-Menopausal    PROCEDURE: CT  - CT ABDOMEN / PELVIS  W  - Jan 08 2013 10:08AM     RESULT: History: Pain.    Comparison  Study: Prior CT of 12/10/2012.    Findings: Standard CT obtained 100 cc of Isovue-300. Coronary artery   disease. Mild cardiomegaly. Minimal amount of pericardial fluid. These   findings are stable. No focal hepatic abnormality. Hepatic veins and   portal veins are patent. Spleen normal. Splenic vein patent. Gallbladder   slightly prominent. No pericholecystic fluid collections or thickened     gallbladder wall noted. No gallstones are noted. No biliary distention.   Common bile duct measures 7 mm. Small cystic lesions in the body and tail   of the pancreas are stable. Adrenals are normal. Simple  renal cysts   again noted. No hydronephrosis or hydroureter. No obstructing ureteral   stone. Phleboliths. Stable aortic aneurysm,2.9 cm in maximum diameter.   This is infrarenal suprailiac. Renal vascular disease is present. Bladder   is nondistended. No pelvic masses. Hysterectomy. Thickening is noted of    the left colonic wall with adjacent fat plane stranding.These findings   could be related to diverticulitis or colitis. Colonic malignancy cannot   be excluded. There is no evidence of bowel obstruction. Appendix normal.   Mild ascites noted. No free air. Mild atelectasis and slight nodularity   again noted in the lung bases. Small inguinal and retroperitoneal lymph   nodes. Right hip replacement.    IMPRESSION:    1. Findings consistent with left colitis versus diverticulitis.   Associated mild ascites. No abscess.  2. Stable tiny cystic lesions in the body and tail the pancreas   consistentwith tiny cysts pseudocyst.  3. Stable small infrarenal suprailiac abdominal aortic aneurysm measuring   2.9 cm in maximum diameter.        Verified By: Osa Craver, M.D., MD   Assessment/Plan:  Assessment/Plan:  Assessment 1) llq pain. improving on abs, leukocytosis also improving.   2) chronic constipation.   Plan 1) continue course of abs to 7-10 days.  consider repeat ct for comparison in about 4-5 weeks.   2) will need to be on miralax as a daily medication. luminal evaluation, colonoscopy versus ACBE or CT colonography, if repeat ct abnormal.   will follow from a distance.   Electronic Signatures: Loistine Simas (MD)  (Signed 26-May-14 12:02)  Authored: Chief Complaint, VITAL SIGNS/ANCILLARY NOTES, Brief Assessment, Lab Results, Radiology Results, Assessment/Plan   Last Updated: 26-May-14 12:02 by Loistine Simas (MD)

## 2014-12-06 NOTE — Discharge Summary (Signed)
PATIENT NAME:  Gloria Martinez, Bryn C MR#:  [id number] DATE OF BIRTH:  1920-06-26  DATE OF ADMISSION:  01/11/2013 DATE OF DISCHARGE:  01/11/2013  DISCHARGE DIAGNOSES:  1.  Acute diverticulitis. 2.  Hypothyroidism.  3.  Hypertension.  4.  Hyperlipidemia.   DISCHARGE MEDICATIONS:  Flagyl 500 mg t.i.d. x 5 days, doxycycline 100 mg b.i.d. x 5 days, Xanax 0.5 mg at bedtime, amitriptyline 25 mg at bedtime, amlodipine 5 mg daily, Lasix 20 mg daily, Synthroid 88 mcg daily, lovastatin 40 mg at bedtime, Toprol-XL 100 mg daily, Klor-Con 10 mEq daily,  Norco 5/325 one every 6 p.r.n. pain.   REASON FOR ADMISSION:  A 81 year old female who presents with severe abdominal pain. Please see H and P for HPI, past medical history, and physical exam.   HOSPITAL COURSE:  The patient was admitted, found to have severe diverticulitis by a CT scan. She received p.o. Flagyl and IV aztreonam. Her white count improved. Her abdominal pain improved. Her diet was increased to a soft diet but due to generalized weakness, she will need skilled nursing.   OVERALL PROGNOSIS:  Guarded   ____________________________ Danella PentonMark F. Miller, MD mfm:jm D: 01/11/2013 08:33:30 ET T: 01/11/2013 10:25:57 ET JOB#: [id number]  cc: Danella PentonMark F. Miller, MD, <Dictator> MARK Sherlene ShamsF MILLER MD ELECTRONICALLY SIGNED 01/12/2013 7:59

## 2014-12-06 NOTE — Consult Note (Signed)
Brief Consult Note: Diagnosis: lower abdominal pain.   Patient was seen by consultant.   Consult note dictated.   Recommend further assessment or treatment.   Comments: Patient seen and examined, chart reviewed. See consult [phone].   Patient presenting with abdominal pain, mostly llq, extending laterally to the left.  Elevated wbc noted.  Clinical presentation c/w diverticulitis, although patient has not been having fever. Pain likely exacerbated by the problem of constipation.  Patient on aztreonam and flagyl, good coverage for this problem.  Recommend CT of abdomen and pelvis for further evaluation, with contrast, iv and oral.  Following.  Electronic Signatures: Barnetta ChapelSkulskie, Martin (MD)  (Signed [phone]-May-14 20:54)  Authored: Brief Consult Note   Last Updated: 25-May-14 20:54 by Barnetta ChapelSkulskie, Martin (MD)

## 2014-12-06 NOTE — H&P (Signed)
PATIENT NAME:  Gloria Martinez, Gloria Martinez MR#:  [id number] DATE OF BIRTH:  [birth year]  DATE OF ADMISSION:  01/06/2013  REFERRING PHYSICIAN:  Dr. Cyril LoosenKinner.   FAMILY PHYSICIAN:  Dr. Bethann PunchesMark Miller.   REASON FOR ADMISSION:  Abdominal pain with nausea, vomiting and leukocytosis.   HISTORY OF PRESENT ILLNESS:  The patient is a 81 year old female who lives at home alone with a history of benign hypertension and hyperlipidemia and hypothyroidism, presents to the Emergency Room with a 2 to 3 day history of progressive abdominal pain in the lower quadrants bilaterally associated with constipation, nausea, and vomiting.  In the Emergency Room, the patient was noted to be afebrile, but had a white count of 25,000.  Plain films suggested some lower abdominal obstruction.  She was disimpacted in the ER with improvement of her symptoms.  She is now admitted for further evaluation.   PAST MEDICAL HISTORY: 1.  Migraine headaches.  2.  Hyperlipidemia.  3.  Benign hypertension.  4.  Hypothyroidism.  5.  Status post cataract surgery.  6.  Status post hysterectomy.  7.  Status post right hip surgery.   MEDICATIONS: 1.  Xanax 0.5 mg by mouth at bedtime.  2.  Elavil 25 mg by mouth at bedtime.  3.  Amlodipine 5 mg by mouth daily.  4.  Lasix 20 mg by mouth daily.  5.  Synthroid 88 mcg by mouth daily.  6.  Lovastatin 40 mg by mouth at bedtime.  7.  Toprol-XL 100 mg by mouth daily.  8.  Klor-Con 10 mEq by mouth daily.  9.  Norco 5/325 1 by mouth q. 4 to 6 hours as needed pain.   ALLERGIES:  PERCOCET, SULFA, CIPRO, PENICILLIN, TUSSIONEX, AND MACROBID.   SOCIAL HISTORY:  The patient is widowed and lives alone.  No history of alcohol or tobacco abuse.   FAMILY HISTORY:  Positive for hypertension and stroke.  Negative for colon or breast cancer.    REVIEW OF SYSTEMS:  CONSTITUTIONAL:  No fever or change in weight.  EYES:  No blurred or double vision.  No glaucoma.  EARS, NOSE, THROAT:  No tinnitus or hearing loss.  No nasal  discharge or bleeding.  No difficulty swallowing.  RESPIRATORY:  No cough or wheezing.  Denies hemoptysis.  No painful respiration.  CARDIOVASCULAR:  No chest pain or orthopnea.  No palpitations.  No syncope.  GASTROINTESTINAL:  No diarrhea.  Otherwise gastrointestinal review of systems as per history of present illness.  GENITOURINARY:  No dysuria or hematuria.  No incontinence.  ENDOCRINE:  No polyuria or polydipsia.  No heat or cold intolerance.  HEMATOLOGIC:  The patient denies anemia, easy bruising, or bleeding.  LYMPHATIC:  No swollen glands.  MUSCULOSKELETAL:  The patient denies pain in her neck, back, shoulders, knees, hips.  No gout.  NEUROLOGIC:  No numbness, although she does have weakness.  Denies stroke or seizures.  PSYCHIATRIC:  The patient denies anxiety, insomnia, or depression.   PHYSICAL EXAMINATION: GENERAL:  The patient is elderly, chronically ill-appearing, but in no acute distress.  VITAL SIGNS:  Vital signs are currently remarkable for a blood pressure of 151/67 with a heart rate of 90 and a respiratory rate of 18.  She is afebrile.  HEENT:  Normocephalic, atraumatic.  Pupils equally round and reactive to light and accommodation.  Extraocular movements are intact.  Sclerae are anicteric.  The conjunctivae are clear.  Oropharynx clear.  NECK:  Supple without JVD.  No adenopathy or thyromegaly is noted.  LUNGS:  Clear to auscultation and percussion without wheezes, rales, or rhonchi.  No dullness.  Respiratory effort is normal.  CARDIAC:  Regular rate and rhythm with normal S1, S2.  No significant rubs, murmurs, or gallops.  PMI is nondisplaced.  Chest wall is nontender.  ABDOMEN:  Soft, but tender in the lower quadrants.  No rebound or guarding.  Normoactive bowel sounds.  No organomegaly or masses were appreciated.  No hernias or bruits were noted.  EXTREMITIES:  Without clubbing, cyanosis, or edema.  Pulses were 1+ bilaterally.  SKIN:  Warm and dry without rash or  lesions.  NEUROLOGIC:  Cranial nerves II through XII grossly intact.  Deep tendon reflexes were symmetric.  Motor and sensory examination is nonfocal.  PSYCHIATRIC:  Revealed a patient who is alert and oriented to person, place, and time.  She was cooperative and used good judgment.   LABORATORY DATA:  CBC revealed a white count of 25.6 with a hemoglobin of 15.7.  Chemistries revealed a glucose of 172 with a BUN of 21, a creatinine of 1.17 with a GFR of 41 and a sodium of 135 with a potassium of 3.7.  Troponin was less than 0.02.  Urinalysis was unremarkable.  A KUB revealed distention of the transverse colon which was possibly secondary to colonic ileus.  This was done pre-impaction.  Chest x-ray revealed minimal opacities which were felt to be due to atelectasis.   ASSESSMENT: 1.  Constipation, now status post impaction.  2.  Abdominal pain.  3.  Nausea, vomiting, resolved.  4.  Leukocytosis.  5.  Dehydration.  6.  Hyperglycemia.  7.  Acute on chronic renal failure.   PLAN:  The patient will be observed on the floor with IV fluids.  We will send off blood and urine cultures and begin IV antibiotics empirically.  We will begin MiraLAX and Colace as a bowel regimen.  Continue her outpatient regimen except we will hold her Lasix because of her dehydration.  We will follow up KUB, chest x-ray and labs in the morning.  We will consult GI because of the patient's abdominal pain.  Hold off on CT at this time as the patient does feel better after disimpaction.  We will follow her sugars with Accu-Cheks before meals and at bedtime and add sliding scale insulin as needed.  Further treatment and evaluation will depend upon the patient's progress.   Total time spent on this patient was 50 minutes.     ____________________________ Duane Lope Judithann Sheen, MD jds:ea D: 01/06/2013 22:33:06 ET T: 01/06/2013 23:00:53 ET JOB#: [id number]  cc: Duane Lope. Judithann Sheen, MD, <Dictator> Danella Penton, MD Shaday Rayborn D Kamsiyochukwu Spickler  MD ELECTRONICALLY SIGNED 01/07/2013 0:12

## 2014-12-06 NOTE — Consult Note (Signed)
PATIENT NAME:  Gloria Martinez, Gloria Martinez MR#:  [id number] DATE OF BIRTH:  1920-04-02  DATE OF CONSULTATION:  01/07/2013  CONSULTING PHYSICIAN:  Christena Deem, MD  PRIMARY CARE PHYSICIAN: Bethann Punches, MD   REASON FOR CONSULTATION: Abdominal pain.   HISTORY OF PRESENT ILLNESS: Ms. Spindler is a very pleasant 81 year old Caucasian female who was admitted to the hospital yesterday with complaints of abdominal pain, nausea, vomiting and findings of leukocytosis on labs. The patient has a history of chronic constipation. When asked what she takes for this particular problem, she states that she takes milk of magnesia. She is familiar with taking MiraLAX; however, has not taken it regularly. She is uncertain how long she has been having problems with left lower quadrant pain, but believes it to be approximately a month. On further discussion with her, she seems to indicate above the belly button in the epigastric region. She states it seems to come and go. There seems to be some confusion for her. She states, however, that the pain decreases after a bowel movement and indeed when she came to the hospital she was impacted, underwent disimpaction and states that today she is feeling much better than she did yesterday. She does continue, however, to have pain in the left lower quadrant. Typically she states she has a bowel movement about every 3 days. She denies any black stools, blood in the stools or slimy stools. There is no history of peptic ulcer disease. She has never had of peptic ulcer disease. She has had an EGD and colonoscopy, these being done on 01/06/2004 at that time for dysphagia and screening purposes. She did undergo dilatation at that time and  had a finding as well as having gastritis. Her colonoscopy showed diverticulosis and some internal external hemorrhoids.   PAST MEDICAL HISTORY: 1. Right hip surgery.  2.  Hysterectomy.  3.  Cataract surgery.  4.  Hypothyroidism.  5.  Benign hypertension.  6.   Hyperlipidemia.  7.  Migraine headaches,   ALLERGIES: SHE IS ALLERGIC TO CIPRO, MACROBID, PENICILLIN, PERCOCET, SULFA, TUSSIONEX.   OUTPATIENT MEDICATIONS:  Alprazolam 0.5 mg once a day, amitriptyline 25 mg once a day at bedtime, amlodipine 5 mg once a day, furosemide 20 mg once a day Levoxyl 88 mcg daily, lovastatin 40 mg once a day at bedtime, metoprolol succinate 100 mg extended-release once a day, Norco 5/325, one every 4 to 6 hours p.r.n. for pain and potassium chloride 10 mEq once a day.  Gastrointestinal Family History: Negative for colorectal cancer, liver disease or ulcers.   SOCIAL HISTORY: The patient lives by herself. There is no history of alcohol or tobacco use.  REVIEW OF SYSTEMS:  A 10-system reviewed per admission history and physical, agree with same.   PHYSICAL EXAMINATION: VITAL SIGNS: Temperature 97.6, pulse 98, respirations 18, blood pressure 124/72.  GENERAL: She is a 81 year old Caucasian female no acute distress. At times there might be a little bit of confusion in her responses, but overall they are quite plain and clear.  HEENT: Normocephalic, atraumatic.  EYES: Anicteric.  NOSE: Septum midline.  OROPHARYNX: No lesions.  NECK: Supple. No JVD.  HEART: Regular rate and rhythm.  LUNGS: Bilaterally clear.  ABDOMEN: Soft. She is markedly tender to palpation in the left lower quadrant, this extending laterally and somewhat toward the back. There are no masses, rebound, or organomegaly. Bowel sounds positive, normoactive.  RECTAL: Anorectal exam is deferred.  EXTREMITIES: No clubbing, cyanosis, or edema.  NEUROLOGICAL: Cranial nerves II through  XII grossly intact. Muscle strength bilaterally equal and symmetric. Deep tendon reflexes bilaterally equal and symmetric.   LABORATORY, DIAGNOSTIC, AND RADIOLOGICAL DATA: On admission to the hospital she had a glucose 172, BUN 21, creatinine 1.17, sodium 135, potassium 3.7, chloride 102, bicarbonate 23, calcium 9.9. Hepatic  profile showing a total protein of 8.6, albumin 4.0, total bilirubin 1.2, alkaline phosphatase 111. AST and ALT were normal range. Troponin I less than 0.02. TSH was 0.995. Hemogram showing white count of 25.6, hemoglobin and hematocrit of 15.7/45.7, platelet count 290. Repeat laboratories this morning showed her white count to continue to remain high at 25.8. Percent neutrophils being 88.4.   She has had 2 abdominal x-rays as well as 2 PA and lateral chest films. Her PA and lateral chest film showed some minimal opacities overlying the heart and lateral views secondary to atelectasis. Possible densities overlying the right medial and left upper lungs, related to the overlying first costochondral junctions. Follow-up film was recommended.  Her KUB yesterday showed some mild distention of the transverse colon secondary to chronic ileus. Distal bowel obstruction not excluded. Relative lucency in the mid and upper abdomen felt to be technical. Her KUB from May 25 showed a nonobstructive bowel gas pattern. The PA and lateral chest film from May 25 was consistent with the previous day's film.   ASSESSMENT:  1.  Constipation. The patient is currently taking milk of magnesia for assistance with her constipation. However, she seems to be a pattern of waiting several days until she feels constipated then taking the medication. I did have a discussion with her that she needed to be taking something on a more regular basis that would we will keep her more regular rather than allowing constipation to occur to begin with. She seems to understand this.  2.  In regards to the issue of the lower abdominal pain, this is appearing much like diverticulitis and indeed with a high white count, would be consistent with that. This could have been exacerbated by the presentation as well of severe constipation. She is feeling somewhat better today; however, she is markedly tender in the left lower quadrant extending laterally.    RECOMMENDATIONS:   Continue on a very light diet for now recommend. Recommend obtaining a CT scan with at least oral contrast. The patient's kidney function is adequate for contrasted film. This would be an advantage as would also be able to evaluate for mesenteric takeoffs. Currently the patient is afebrile. We will follow with you.    ____________________________ Christena DeemMartin U. Skulskie, MD mus:cc D: 01/07/2013 20:46:48 ET T: 01/07/2013 20:59:10 ET JOB#: [id number]  cc: Christena DeemMartin U. Skulskie, MD, <Dictator> Danella PentonMark F. Miller, MD Christena DeemMARTIN U SKULSKIE MD ELECTRONICALLY SIGNED 01/16/2013 15:10

## 2016-06-12 ENCOUNTER — Emergency Department: Payer: Medicare Other

## 2016-06-12 ENCOUNTER — Encounter: Payer: Self-pay | Admitting: Emergency Medicine

## 2016-06-12 ENCOUNTER — Emergency Department
Admission: EM | Admit: 2016-06-12 | Discharge: 2016-06-12 | Disposition: A | Discharge status: Home / Self Care | Payer: Medicare Other | Attending: Emergency Medicine | Admitting: Emergency Medicine

## 2016-06-12 DIAGNOSIS — Z96641 Presence of right artificial hip joint: Secondary | ICD-10-CM | POA: Diagnosis not present

## 2016-06-12 DIAGNOSIS — R296 Repeated falls: Secondary | ICD-10-CM

## 2016-06-12 DIAGNOSIS — I509 Heart failure, unspecified: Secondary | ICD-10-CM | POA: Insufficient documentation

## 2016-06-12 DIAGNOSIS — R8299 Other abnormal findings in urine: Secondary | ICD-10-CM | POA: Diagnosis not present

## 2016-06-12 DIAGNOSIS — I110 Hypertensive heart disease with heart failure: Secondary | ICD-10-CM | POA: Insufficient documentation

## 2016-06-12 DIAGNOSIS — Z79899 Other long term (current) drug therapy: Secondary | ICD-10-CM | POA: Diagnosis not present

## 2016-06-12 DIAGNOSIS — W19XXXA Unspecified fall, initial encounter: Secondary | ICD-10-CM | POA: Diagnosis not present

## 2016-06-12 DIAGNOSIS — F0390 Unspecified dementia without behavioral disturbance: Secondary | ICD-10-CM | POA: Insufficient documentation

## 2016-06-12 DIAGNOSIS — Y929 Unspecified place or not applicable: Secondary | ICD-10-CM | POA: Diagnosis not present

## 2016-06-12 DIAGNOSIS — Z7982 Long term (current) use of aspirin: Secondary | ICD-10-CM | POA: Insufficient documentation

## 2016-06-12 DIAGNOSIS — Y999 Unspecified external cause status: Secondary | ICD-10-CM | POA: Diagnosis not present

## 2016-06-12 DIAGNOSIS — E039 Hypothyroidism, unspecified: Secondary | ICD-10-CM | POA: Insufficient documentation

## 2016-06-12 DIAGNOSIS — Y9389 Activity, other specified: Secondary | ICD-10-CM | POA: Insufficient documentation

## 2016-06-12 DIAGNOSIS — M25552 Pain in left hip: Secondary | ICD-10-CM | POA: Diagnosis present

## 2016-06-12 HISTORY — DX: Heart failure, unspecified: I50.9

## 2016-06-12 LAB — CBC WITH DIFFERENTIAL/PLATELET
Basophils Absolute: 0 10*3/uL (ref 0–0.1)
Basophils Relative: 1 %
EOS ABS: 0.3 10*3/uL (ref 0–0.7)
EOS PCT: 4 %
HCT: 40.8 % (ref 35.0–47.0)
Hemoglobin: 13.9 g/dL (ref 12.0–16.0)
LYMPHS ABS: 1.5 10*3/uL (ref 1.0–3.6)
Lymphocytes Relative: 21 %
MCH: 31.9 pg (ref 26.0–34.0)
MCHC: 34.1 g/dL (ref 32.0–36.0)
MCV: 93.8 fL (ref 80.0–100.0)
MONO ABS: 0.6 10*3/uL (ref 0.2–0.9)
MONOS PCT: 9 %
Neutro Abs: 4.7 10*3/uL (ref 1.4–6.5)
Neutrophils Relative %: 65 %
PLATELETS: 244 10*3/uL (ref 150–440)
RBC: 4.35 MIL/uL (ref 3.80–5.20)
RDW: 13.2 % (ref 11.5–14.5)
WBC: 7.1 10*3/uL (ref 3.6–11.0)

## 2016-06-12 LAB — URINALYSIS COMPLETE WITH MICROSCOPIC (ARMC ONLY)
BILIRUBIN URINE: NEGATIVE
Bacteria, UA: NONE SEEN
Glucose, UA: NEGATIVE mg/dL
KETONES UR: NEGATIVE mg/dL
Nitrite: NEGATIVE
PROTEIN: NEGATIVE mg/dL
Specific Gravity, Urine: 1.01 (ref 1.005–1.030)
pH: 6 (ref 5.0–8.0)

## 2016-06-12 LAB — BASIC METABOLIC PANEL
Anion gap: 5 (ref 5–15)
BUN: 20 mg/dL (ref 6–20)
CO2: 27 mmol/L (ref 22–32)
CREATININE: 0.94 mg/dL (ref 0.44–1.00)
Calcium: 9.1 mg/dL (ref 8.9–10.3)
Chloride: 102 mmol/L (ref 101–111)
GFR calc Af Amer: 58 mL/min — ABNORMAL LOW (ref >60)
GFR, EST NON AFRICAN AMERICAN: 50 mL/min — AB (ref >60)
GLUCOSE: 193 mg/dL — AB (ref 65–99)
Potassium: 4.3 mmol/L (ref 3.5–5.1)
SODIUM: 134 mmol/L — AB (ref 135–145)

## 2016-06-12 LAB — TROPONIN I: Troponin I: 0.01 ng/mL (ref <0.03)

## 2016-06-12 NOTE — ED Triage Notes (Signed)
Pt arrived via EMS from '[facility]' with reports of an unwitness fall while sitting down eating breakfast per EMS.  Staff member told EMS that they heard a "plop" and pt was found on the floor.  Pt has a PMH dementia and has yellow DNR form with her.  Pt c/o left hip pain on palpation.

## 2016-06-12 NOTE — ED Provider Notes (Signed)
[facility] Emergency Department Provider Note  ____________________________________________  Time seen: Approximately 8:40 AM  I have reviewed the triage vital signs and the nursing notes.   HISTORY  Chief Complaint Fall and Hip Pain  Level 5 caveat:  Portions of the history and physical were unable to be obtained due to dementia   HPI Gloria Martinez is a 82 y.o. female history of dementia who presents for evaluation of unwitnessed fall. Patient was found on the floor in her SNF next to the table where she was having breakfast. Patient tells me that she fell but does not remember the fall. Patient notes she is in the hospital and is currently at her baseline per EMS staff. Patient denies headache, back pain, chest pain, shortness of breath, abdominal pain, extremity pain.  Past Medical History:  Diagnosis Date  . Anxiety   . CHF (congestive heart failure) (HCC)   . Dementia   . Diverticulitis   . Dysphagia   . Epilepsy (HCC)   . Glaucoma   . Hyperlipidemia   . Hypertension   . Hypothyroidism   . Muscle weakness     Patient Active Problem List   Diagnosis Date Noted  . Hip fracture (HCC) 08/09/2013    Past Surgical History:  Procedure Laterality Date  . ABDOMINAL HYSTERECTOMY    . Hip Replacement  Right 2009    Prior to Admission medications   Medication Sig Start Date End Date Taking? Authorizing Provider  amitriptyline (ELAVIL) 25 MG tablet Take 25 mg by mouth at bedtime.    Historical Provider, MD  amLODipine (NORVASC) 5 MG tablet Take 5 mg by mouth daily.    Historical Provider, MD  aspirin EC 81 MG tablet Take 81 mg by mouth daily.    Historical Provider, MD  donepezil (ARICEPT) 5 MG tablet Take 10 mg by mouth at bedtime.     Historical Provider, MD  furosemide (LASIX) 20 MG tablet Take 20 mg by mouth daily.    Historical Provider, MD  latanoprost (XALATAN) 0.005 % ophthalmic solution Place 1 drop into both eyes at bedtime.     Historical Provider, MD  levothyroxine (SYNTHROID, LEVOTHROID) 88 MCG tablet Take 88 mcg by mouth daily before breakfast.    Historical Provider, MD  lovastatin (MEVACOR) 20 MG tablet Take 40 mg by mouth at bedtime.    Historical Provider, MD  metoprolol succinate (TOPROL-XL) 50 MG 24 hr tablet Take 100 mg by mouth daily. Take with or immediately following a meal.    Historical Provider, MD  omeprazole (PRILOSEC) 20 MG capsule Take 20 mg by mouth daily.    Historical Provider, MD  potassium chloride 20 MEQ/15ML (10%) solution Take 10 mEq by mouth daily.    Historical Provider, MD  senna-docusate (SENOKOT-S) 8.6-50 MG per tablet Take 1 tablet by mouth 2 (two) times daily.    Historical Provider, MD    Allergies Ciprofloxacin; Macrobid [nitrofurantoin monohyd macro]; Penicillins; Percocet [oxycodone-acetaminophen]; Sulfa antibiotics; Tussionex pennkinetic er [employer][hydrocod polst-cpm polst er]; and Vicodin [hydrocodone-acetaminophen]  History reviewed. No pertinent family history.  Social History Social History  Substance Use Topics  . Smoking status: Never Smoker  . Smokeless tobacco: Never Used  . Alcohol use No    Review of Systems Constitutional: Negative for fever. Eyes: Negative for visual changes. ENT: Negative for facial injury or neck injury Cardiovascular: Negative for chest injury. Respiratory: Negative for shortness of breath. Negative for chest wall injury. Gastrointestinal: Negative for abdominal pain or injury.  Genitourinary: Negative for dysuria. Musculoskeletal: Negative for back injury, negative for arm or leg pain. Skin: Negative for laceration/abrasions. Neurological: Negative for head injury.  ___________________________________________   PHYSICAL EXAM:  VITAL SIGNS: Vitals:   06/12/16 1031 06/12/16 1100  BP: (!) 173/72 (!) 159/67  Pulse: (!) 54 (!) 52  Resp: 18 17  Temp:     Constitutional: Alert and oriented x2. No acute distress. Does not appear  intoxicated. HEENT Head: Normocephalic and atraumatic. Face: No facial bony tenderness. Stable midface Ears: No hemotympanum bilaterally. No Battle sign Eyes: No eye injury. PERRL. No raccoon eyes Nose: Nontender. No epistaxis. No rhinorrhea Mouth/Throat: Mucous membranes are moist. No oropharyngeal blood. No dental injury. Airway patent without stridor. Normal voice. Neck: no C-collar in place. No midline c-spine tenderness.  Cardiovascular: Normal rate, regular rhythm. Normal and symmetric distal pulses are present in all extremities. Pulmonary/Chest: Chest wall is stable and nontender to palpation/compression. Normal respiratory effort. Breath sounds are normal. No crepitus.  Abdominal: Soft, nontender, non distended. Musculoskeletal: Nontender with normal full range of motion in all extremities. No deformities. No thoracic or lumbar midline spinal tenderness. Pelvis is stable with mild ttp over the lateral proximal femur. Full painless ROM of b/l hips Skin: Skin is warm, dry and intact. No abrasions or contutions. Psychiatric: Speech and behavior are appropriate. Neurological: Normal speech and language. Moves all extremities to command. No gross focal neurologic deficits are appreciated.  Glascow Coma Score: 4 - Opens eyes on own 6 - Follows simple motor commands 5 - Alert and oriented GCS: 15   ____________________________________________   LABS (all labs ordered are listed, but only abnormal results are displayed)  Labs Reviewed  BASIC METABOLIC PANEL - Abnormal; Notable for the following:       Result Value   Sodium 134 (*)    Glucose, Bld 193 (*)    GFR calc non Af Amer 50 (*)    GFR calc Af Amer 58 (*)    All other components within normal limits  URINALYSIS COMPLETEWITH MICROSCOPIC ([facility] ONLY) - Abnormal; Notable for the following:    Color, Urine YELLOW (*)    APPearance CLEAR (*)    Hgb urine dipstick 2+ (*)    Leukocytes, UA TRACE (*)    Squamous Epithelial /  LPF 0-5 (*)    All other components within normal limits  URINE CULTURE  TROPONIN I  CBC WITH DIFFERENTIAL/PLATELET   ____________________________________________  EKG  ED ECG REPORT I, Nita Sicklearolina Zoraida Havrilla, the attending physician, personally viewed and interpreted this ECG.  Sinus bradycardia, rate of 55, first-degree AV block, normal QRS and QTc intervals, normal axis, no ST elevations or depressions, nonspecific T-wave abnormalities on inferior leads. Unchanged from prior ____________________________________________  RADIOLOGY  CT head and c-spine: negative  XR left hip: 1. Possible nondisplaced left inferior pubic ramus fracture. 2. Diffuse osteopenia. ____________________________________________   PROCEDURES  Procedure(s) performed: None Procedures Critical Care performed:  None ____________________________________________   INITIAL IMPRESSION / ASSESSMENT AND PLAN / ED COURSE  82 y.o. female history of dementia who presents for evaluation of unwitnessed fall. Patient has no complaints at this time. No obvious trauma. She does complain of mild tenderness to palpation of the left proximal lateral femur however she does have full painless range of motion of bilateral hips. We'll get a head CT, CT cervical spine, x-ray of the hip, blood work, EKG, urinalysis. We'll watch patient on telemetry.  Clinical Course  Comment By Time  Blood, UA, and imaging all  within normal limits. Hip Xray with questionable nondisplaced left inferior pubic ramus fracture. Patient able to stand and bear weight with no pain and has no ttp of her pelvis. She is only capable of transferring to and from the wheelchair at baseline and able to do it here with no pain or difficulty. No arrhythmias on telemetry. Patient remains at baseline with no complaints. Will dc back to SNF. Nita Sickle, MD 10/28 1112   Family at the bedside agrees patient is at baseline.  Pertinent labs & imaging results that  were available during my care of the patient were reviewed by me and considered in my medical decision making (see chart for details).    ____________________________________________   FINAL CLINICAL IMPRESSION(S) / ED DIAGNOSES  Final diagnoses:  Unwitnessed fall  Dementia without behavioral disturbance, unspecified dementia type      NEW MEDICATIONS STARTED DURING THIS VISIT:  New Prescriptions   No medications on file     Note:  This document was prepared using Dragon voice recognition software and may include unintentional dictation errors.    Nita Sickle, MD 06/12/16 [phone]

## 2016-06-12 NOTE — ED Notes (Signed)
Safety alarm placed on patient at this time.

## 2016-06-13 LAB — URINE CULTURE: CULTURE: NO GROWTH

## 2016-08-21 ENCOUNTER — Encounter: Payer: Self-pay | Admitting: Emergency Medicine

## 2016-08-21 ENCOUNTER — Emergency Department
Admission: EM | Admit: 2016-08-21 | Discharge: 2016-08-22 | Disposition: A | Discharge status: Skilled Nursing Facility | Payer: Medicare Other | Attending: Emergency Medicine | Admitting: Emergency Medicine

## 2016-08-21 DIAGNOSIS — M25552 Pain in left hip: Secondary | ICD-10-CM

## 2016-08-21 DIAGNOSIS — I509 Heart failure, unspecified: Secondary | ICD-10-CM | POA: Diagnosis not present

## 2016-08-21 DIAGNOSIS — Z96641 Presence of right artificial hip joint: Secondary | ICD-10-CM | POA: Diagnosis not present

## 2016-08-21 DIAGNOSIS — E039 Hypothyroidism, unspecified: Secondary | ICD-10-CM | POA: Insufficient documentation

## 2016-08-21 DIAGNOSIS — Z79899 Other long term (current) drug therapy: Secondary | ICD-10-CM | POA: Diagnosis not present

## 2016-08-21 DIAGNOSIS — Z7982 Long term (current) use of aspirin: Secondary | ICD-10-CM | POA: Diagnosis not present

## 2016-08-21 DIAGNOSIS — I2510 Atherosclerotic heart disease of native coronary artery without angina pectoris: Secondary | ICD-10-CM | POA: Insufficient documentation

## 2016-08-21 DIAGNOSIS — I110 Hypertensive heart disease with heart failure: Secondary | ICD-10-CM | POA: Diagnosis not present

## 2016-08-21 HISTORY — DX: Atherosclerotic heart disease of native coronary artery without angina pectoris: I25.10

## 2016-08-21 MED ORDER — KETOROLAC TROMETHAMINE 30 MG/ML IJ SOLN
15.0000 mg | Freq: Once | INTRAMUSCULAR | Status: AC
  Administered 2016-08-22: 15 mg via INTRAVENOUS
  Filled 2016-08-21: qty 1

## 2016-08-21 NOTE — ED Provider Notes (Signed)
Time Seen: Approximately 2358  I have reviewed the triage notes  Chief Complaint: Hip Pain   History of Present Illness: Gloria Martinez is a 83 y.o. female who presents with a 2 day history of  nontraumatic left hip pain. No fever. No nausea or vomiting or abdominal pain. Patient was transported here from the [facility] by EMS. She does have a history of dementia which makes her somewhat of an unreliable historian. She's had previous right hip replacement surgery.   Past Medical History:  Diagnosis Date  . Anxiety   . CHF (congestive heart failure) (HCC)   . Coronary artery disease   . Dementia   . Diverticulitis   . Dysphagia   . Epilepsy (HCC)   . Glaucoma   . Hyperlipidemia   . Hypertension   . Hypothyroidism   . Muscle weakness     Patient Active Problem List   Diagnosis Date Noted  . Hip fracture (HCC) 08/09/2013    Past Surgical History:  Procedure Laterality Date  . ABDOMINAL HYSTERECTOMY    . Hip Replacement  Right 2009    Past Surgical History:  Procedure Laterality Date  . ABDOMINAL HYSTERECTOMY    . Hip Replacement  Right 2009    Current Outpatient Rx  . Order #: [id number] Class: Historical Med  . Order #: [id number] Class: Historical Med  . Order #: [id number] Class: Historical Med  . Order #: [id number] Class: Historical Med  . Order #: [id number] Class: Historical Med  . Order #: [id number] Class: Historical Med  . Order #: [id number] Class: Historical Med  . Order #: [id number] Class: Historical Med  . Order #: [id number] Class: Historical Med  . Order #: [id number] Class: Historical Med  . Order #: [id number] Class: Historical Med  . Order #: [id number] Class: Historical Med    Allergies:  Ciprofloxacin; Macrobid [nitrofurantoin monohyd macro]; Penicillins; Percocet [oxycodone-acetaminophen]; Sulfa antibiotics; Tussionex pennkinetic er [employer][hydrocod polst-cpm polst er]; and Vicodin [hydrocodone-acetaminophen]  Family History: History reviewed. No pertinent family  history.  Social History: Social History  Substance Use Topics  . Smoking status: Never Smoker  . Smokeless tobacco: Never Used  . Alcohol use No     Review of Systems:   10 point review of systems was performed and was otherwise negative:  Constitutional: No fever Eyes: No visual disturbances ENT: No sore throat, ear pain Cardiac: No chest pain Respiratory: No shortness of breath, wheezing, or stridor Abdomen: No abdominal pain, no vomiting, No diarrhea Endocrine: No weight loss, No night sweats Extremities: No peripheral edema, cyanosis Skin: No rashes, easy bruising Neurologic: No focal weakness, trouble with speech or swollowing Urologic: No dysuria, Hematuria, or urinary frequency   Physical Exam:  ED Triage Vitals  Enc Vitals Group     BP 08/21/16 2347 110/77     Pulse Rate 08/21/16 2347 62     Resp 08/21/16 2347 18     Temp 08/21/16 2347 98.2 F (36.8 C)     Temp Source 08/21/16 2347 Oral     SpO2 08/21/16 2347 100 %     Weight 08/21/16 2350 120 lb (54.4 kg)     Height 08/21/16 2350 5\' 5"  (1.651 m)     Head Circumference --      Peak Flow --      Pain Score 08/21/16 2351 2     Pain Loc --      Pain Edu? --      Excl. in GC? --     General: Awake , Alert , and Oriented times2, cooperative.  His uncomfortable with any movement of the left side and the hip area specifically  Head: Normal cephalic , atraumatic Eyes: Pupils equal , round, reactive to light Nose/Throat: No nasal drainage, patent upper airway without erythema or exudate.  Neck: Supple, Full range of motion, No anterior adenopathy or palpable thyroid masses Lungs: Clear to ascultation without wheezes , rhonchi, or rales Heart: Regular rate, regular rhythm without murmurs , gallops , or rubs Abdomen: Soft, non tender without rebound, guarding , or rigidity; bowel sounds positive and symmetric in all 4 quadrants. No organomegaly .        Extremities: Limited movement and examined the left hip.  Patient appears to be neurovascularly intact with good 2+ dorsalis pedis pulse on the left. Sensory appears to be intact there is no obvious crepitus or step-off noted no joint shortening Neurologic: normal ambulation, Motor symmetric without deficits, sensory intact Skin: warm, dry, no rashes   Labs:   All laboratory work was reviewed including any pertinent negatives or positives listed below:  Labs Reviewed  CBC WITH DIFFERENTIAL/PLATELET  URINALYSIS, COMPLETE (UACMP) WITH MICROSCOPIC  COMPREHENSIVE METABOLIC PANEL  Laboratory work was reviewed and showed no clinically significant abnormalities.   Radiology:  "Dg Hip Infant Unilat With Pelvis 2-3 Views Left  Result Date: 08/22/2016 CLINICAL DATA:  83 y/o F; left hip pain. History of fall 08/25/2026. EXAM: DG HIP (WITH OR WITHOUT PELVIS) INFANT 2-3V LEFT COMPARISON:  06/12/2016 hip radiographs. FINDINGS: Right total hip arthroplasty without apparent hardware related complication, partially visualized. No acute fracture or dislocation identified. Pubic symphysis and sacroiliac joints are maintained. Pelvic phleboliths. Vascular calcifications. IMPRESSION: Negative. Electronically Signed   By: Mitzi Hansen M.D.   On: 08/22/2016 01:23  "    I personally reviewed the radiologic studies     ED Course:  Patient's stay here was uneventful and her husband arrived and was able to give a more detailed history and states this left hip pains actually been going on for a week. She had similar pain on the right side which turned out to be bursitis. She has an orthopedic surgeon in [place] and recommended follow-up as soon as possible. The patient had had the history verified that there was no traumatic injury to the left hip and the patient's limited with ambulation on a chronic basis and only transitions. I felt that this was unlikely to be osteomyelitis though advised if the patient develops a fever, redness etc. that she should be  sent back to the emergency department. Clinical Course      Assessment:  Acute nontraumatic left hip pain   Final Clinical Impression: *  Final diagnoses:  Acute hip pain, left     Plan:  Outpatient " New Prescriptions   LIDOCAINE (LIDODERM) 5 %    Place 1 patch onto the skin daily.  " Patient was advised to return immediately if condition worsens. Patient was advised to follow up with their primary care physician or other specialized physicians involved in their outpatient care. The patient and/or family member/power of attorney had laboratory results reviewed at the bedside. All questions and concerns were addressed and appropriate discharge instructions were distributed by the nursing staff.            Jennye Moccasin, MD 08/22/16 [phone]

## 2016-08-21 NOTE — ED Triage Notes (Signed)
Pt presents to ED from [facility] by EMS with c/o non-traumatic left hip pain. Pt denies any recent fall. EMS VS stable.

## 2016-08-22 ENCOUNTER — Emergency Department: Payer: Medicare Other

## 2016-08-22 DIAGNOSIS — M25552 Pain in left hip: Secondary | ICD-10-CM | POA: Diagnosis not present

## 2016-08-22 LAB — URINALYSIS, COMPLETE (UACMP) WITH MICROSCOPIC
BILIRUBIN URINE: NEGATIVE
GLUCOSE, UA: NEGATIVE mg/dL
HGB URINE DIPSTICK: NEGATIVE
Ketones, ur: NEGATIVE mg/dL
NITRITE: NEGATIVE
Protein, ur: NEGATIVE mg/dL
RBC / HPF: NONE SEEN RBC/hpf (ref 0–5)
SPECIFIC GRAVITY, URINE: 1.011 (ref 1.005–1.030)
pH: 7 (ref 5.0–8.0)

## 2016-08-22 LAB — CBC WITH DIFFERENTIAL/PLATELET
BASOS ABS: 0.1 10*3/uL (ref 0–0.1)
BASOS PCT: 1 %
Eosinophils Absolute: 0.3 10*3/uL (ref 0–0.7)
Eosinophils Relative: 3 %
HEMATOCRIT: 43.2 % (ref 35.0–47.0)
HEMOGLOBIN: 14.6 g/dL (ref 12.0–16.0)
Lymphocytes Relative: 25 %
Lymphs Abs: 2.5 10*3/uL (ref 1.0–3.6)
MCH: 31.3 pg (ref 26.0–34.0)
MCHC: 33.8 g/dL (ref 32.0–36.0)
MCV: 92.4 fL (ref 80.0–100.0)
MONO ABS: 0.9 10*3/uL (ref 0.2–0.9)
Monocytes Relative: 9 %
NEUTROS ABS: 6.3 10*3/uL (ref 1.4–6.5)
NEUTROS PCT: 62 %
Platelets: 277 10*3/uL (ref 150–440)
RBC: 4.67 MIL/uL (ref 3.80–5.20)
RDW: 13.9 % (ref 11.5–14.5)
WBC: 10 10*3/uL (ref 3.6–11.0)

## 2016-08-22 LAB — COMPREHENSIVE METABOLIC PANEL
ALK PHOS: 69 U/L (ref 38–126)
ALT: 14 U/L (ref 14–54)
ANION GAP: 8 (ref 5–15)
AST: 23 U/L (ref 15–41)
Albumin: 4 g/dL (ref 3.5–5.0)
BILIRUBIN TOTAL: 0.7 mg/dL (ref 0.3–1.2)
BUN: 22 mg/dL — ABNORMAL HIGH (ref 6–20)
CALCIUM: 9.4 mg/dL (ref 8.9–10.3)
CO2: 22 mmol/L (ref 22–32)
CREATININE: 0.9 mg/dL (ref 0.44–1.00)
Chloride: 105 mmol/L (ref 101–111)
GFR, EST NON AFRICAN AMERICAN: 53 mL/min — AB (ref >60)
Glucose, Bld: 114 mg/dL — ABNORMAL HIGH (ref 65–99)
Potassium: 3.7 mmol/L (ref 3.5–5.1)
SODIUM: 135 mmol/L (ref 135–145)
TOTAL PROTEIN: 7.5 g/dL (ref 6.5–8.1)

## 2016-08-22 MED ORDER — LIDOCAINE 5 % EX PTCH: 1.0000 | MEDICATED_PATCH | CUTANEOUS | 0 refills | Status: DC

## 2016-08-22 MED ORDER — LIDOCAINE 5 % EX PTCH
1.0000 | MEDICATED_PATCH | CUTANEOUS | Status: DC
  Administered 2016-08-22: 1 via TRANSDERMAL
  Filled 2016-08-22: qty 1

## 2016-08-22 NOTE — ED Notes (Signed)
Pt assisted with use of bedpan. Tolerated well. No distress noted.

## 2016-08-22 NOTE — ED Notes (Addendum)
Pt returned from xray. tolerated well per xray tech.

## 2016-08-22 NOTE — Discharge Instructions (Signed)
Please return immediately if condition worsens. Please contact her primary physician or the physician you were given for referral. If you have any specialist physicians involved in her treatment and plan please also contact them. Thank you for using [facility].  Please contact the orthopedic surgeon in [place] for follow-up as soon as possible  X-rays of the left hip are negative and we felt clinically that this is likely either arthritic or bursitis. Patient develops a fever please return to the emergency department.

## 2016-08-22 NOTE — ED Notes (Signed)
Assisted patient with getting her underwear on and patient had wet the bed. Cleaned up bed with fresh clean linens and new chux underneath patient. Patient resting now

## 2016-09-21 ENCOUNTER — Emergency Department: Payer: Medicare Other

## 2016-09-21 ENCOUNTER — Emergency Department
Admission: EM | Admit: 2016-09-21 | Discharge: 2016-09-21 | Disposition: A | Discharge status: Home / Self Care | Payer: Medicare Other | Attending: Emergency Medicine | Admitting: Emergency Medicine

## 2016-09-21 DIAGNOSIS — I2510 Atherosclerotic heart disease of native coronary artery without angina pectoris: Secondary | ICD-10-CM | POA: Insufficient documentation

## 2016-09-21 DIAGNOSIS — Z7982 Long term (current) use of aspirin: Secondary | ICD-10-CM | POA: Diagnosis not present

## 2016-09-21 DIAGNOSIS — Y92121 Bathroom in nursing home as the place of occurrence of the external cause: Secondary | ICD-10-CM | POA: Insufficient documentation

## 2016-09-21 DIAGNOSIS — I509 Heart failure, unspecified: Secondary | ICD-10-CM | POA: Diagnosis not present

## 2016-09-21 DIAGNOSIS — M545 Low back pain: Secondary | ICD-10-CM | POA: Diagnosis not present

## 2016-09-21 DIAGNOSIS — W1839XA Other fall on same level, initial encounter: Secondary | ICD-10-CM | POA: Diagnosis not present

## 2016-09-21 DIAGNOSIS — E039 Hypothyroidism, unspecified: Secondary | ICD-10-CM | POA: Diagnosis not present

## 2016-09-21 DIAGNOSIS — I110 Hypertensive heart disease with heart failure: Secondary | ICD-10-CM | POA: Insufficient documentation

## 2016-09-21 DIAGNOSIS — S3992XA Unspecified injury of lower back, initial encounter: Secondary | ICD-10-CM | POA: Diagnosis present

## 2016-09-21 DIAGNOSIS — G9389 Other specified disorders of brain: Secondary | ICD-10-CM | POA: Insufficient documentation

## 2016-09-21 DIAGNOSIS — Z79899 Other long term (current) drug therapy: Secondary | ICD-10-CM | POA: Diagnosis not present

## 2016-09-21 DIAGNOSIS — W19XXXA Unspecified fall, initial encounter: Secondary | ICD-10-CM

## 2016-09-21 DIAGNOSIS — Y999 Unspecified external cause status: Secondary | ICD-10-CM | POA: Diagnosis not present

## 2016-09-21 DIAGNOSIS — Y9389 Activity, other specified: Secondary | ICD-10-CM | POA: Diagnosis not present

## 2016-09-21 NOTE — ED Notes (Signed)
Pt arrived via ems - it is reported that she fell while on the toilet - pt denies this but does have a hx of dementia - pt denies any pain at this time but reported to nursing home ([facility]) staff that her tailbone hurt 

## 2016-09-21 NOTE — ED Notes (Signed)
Patient transported to X-ray 

## 2016-09-21 NOTE — ED Provider Notes (Signed)
[facility] Emergency Department Provider Note  ____________________________________________  Time seen: Approximately 1:45 PM  I have reviewed the triage vital signs and the nursing notes.   HISTORY  Chief Complaint Fall  Level 5 caveat:  Portions of the history and physical were unable to be obtained due to dementia   HPI Gloria Martinez is a 83 y.o. female who presents from [facility] by EMS status post mechanical fall. Patient was on the toilet being assisted by staff from the facility when she fell off the toilet and into her buttock. She was complaining of buttock pain before arrival. No head trauma or LOC. She does not take any anticoagulants. Patient has a history of dementia and does not remember what happened or why she is here. She has no complaints at this time and denies headache, neck pain, back pain, chest pain, abdominal pain, extremity pain.  Past Medical History:  Diagnosis Date  . Anxiety   . CHF (congestive heart failure) (HCC)   . Coronary artery disease   . Dementia   . Diverticulitis   . Dysphagia   . Epilepsy (HCC)   . Glaucoma   . Hyperlipidemia   . Hypertension   . Hypothyroidism   . Muscle weakness     Patient Active Problem List   Diagnosis Date Noted  . Hip fracture (HCC) 08/09/2013    Past Surgical History:  Procedure Laterality Date  . ABDOMINAL HYSTERECTOMY    . Hip Replacement  Right 2009    Prior to Admission medications   Medication Sig Start Date End Date Taking? Authorizing Provider  acetaminophen (TYLENOL) 325 MG tablet Take 650 mg by mouth every 6 (six) hours as needed.   Yes Historical Provider, MD  amLODipine (NORVASC) 5 MG tablet Take 5 mg by mouth daily.   Yes Historical Provider, MD  aspirin EC 81 MG tablet Take 81 mg by mouth daily.   Yes Historical Provider, MD  Cholecalciferol (D3-1000) 1000 units tablet Take 1,000 Units by mouth daily.   Yes Historical Provider, MD  citalopram (CELEXA) 10 MG tablet  Take 10 mg by mouth daily.   Yes Historical Provider, MD  furosemide (LASIX) 20 MG tablet Take 20 mg by mouth daily.   Yes Historical Provider, MD  latanoprost (XALATAN) 0.005 % ophthalmic solution Place 1 drop into both eyes at bedtime.   Yes Historical Provider, MD  levothyroxine (SYNTHROID, LEVOTHROID) 125 MCG tablet Take 125 mcg by mouth daily before breakfast.    Yes Historical Provider, MD  lidocaine (LIDODERM) 5 % Place 1 patch onto the skin daily. 08/22/16  Yes Jennye MoccasinBrian S Quigley, MD  Melatonin 3 MG TABS Take 3 mg by mouth at bedtime.   Yes Historical Provider, MD  metoprolol succinate (TOPROL-XL) 100 MG 24 hr tablet Take 100 mg by mouth daily. Take with or immediately following a meal.   Yes Historical Provider, MD  ondansetron (ZOFRAN) 4 MG tablet Take 4 mg by mouth every 8 (eight) hours as needed for nausea or vomiting.   Yes Historical Provider, MD  Polyethyl Glycol-Propyl Glycol (LUBRICANT EYE DROPS) 0.4-0.3 % SOLN Place 1 drop into both eyes 2 (two) times daily.   Yes Historical Provider, MD  polyethylene glycol (MIRALAX / GLYCOLAX) packet Take 17 g by mouth every other day.   Yes Historical Provider, MD  POTASSIUM CHLORIDE PO Take 10 mEq by mouth daily.   Yes Historical Provider, MD  senna-docusate (SENOKOT-S) 8.6-50 MG per tablet Take 1 tablet by mouth  2 (two) times daily.   Yes Historical Provider, MD  traZODone (DESYREL) 50 MG tablet Take 25 mg by mouth at bedtime.   Yes Historical Provider, MD    Allergies Ciprofloxacin; Macrobid [nitrofurantoin monohyd macro]; Penicillins; Percocet [oxycodone-acetaminophen]; Sulfa antibiotics; Tussionex pennkinetic er [hydrocod polst-cpm polst er]; and Vicodin [hydrocodone-acetaminophen]  No family history on file.  Social History Social History  Substance Use Topics  . Smoking status: Never Smoker  . Smokeless tobacco: Never Used  . Alcohol use No    Review of Systems Constitutional: Negative for fever. Eyes: Negative for visual  changes. ENT: Negative for facial injury or neck injury Cardiovascular: Negative for chest injury. Respiratory: Negative for shortness of breath. Negative for chest wall injury. Gastrointestinal: Negative for abdominal pain or injury. Genitourinary: Negative for dysuria. Musculoskeletal: Negative for back injury, negative for arm or leg pain. + buttock pain Skin: Negative for laceration/abrasions. Neurological: Negative for head injury.   ____________________________________________   PHYSICAL EXAM:  VITAL SIGNS: Vitals:   09/21/16 1500 09/21/16 1515  BP: 126/77 136/64  Pulse: 78 78  Resp:    Temp:     Constitutional: Alert and oriented to self only. No acute distress. Does not appear intoxicated. HEENT Head: Normocephalic and atraumatic. Face: No facial bony tenderness. Stable midface Ears: No hemotympanum bilaterally. No Battle sign Eyes: No eye injury. PERRL. No raccoon eyes Nose: Nontender. No epistaxis. No rhinorrhea Mouth/Throat: Mucous membranes are moist. No oropharyngeal blood. No dental injury. Airway patent without stridor. Normal voice. Neck: no C-collar in place. No midline c-spine tenderness.  Cardiovascular: Normal rate, regular rhythm. Normal and symmetric distal pulses are present in all extremities. Pulmonary/Chest: Chest wall is stable and nontender to palpation/compression. Normal respiratory effort. Breath sounds are normal. No crepitus.  Abdominal: Soft, nontender, non distended. Musculoskeletal: Nontender with normal full range of motion in all extremities. No deformities. No thoracic or lumbar midline spinal tenderness. Pelvis is stable. Skin: Skin is warm, dry and intact. No abrasions or contutions. Psychiatric: Speech and behavior are appropriate. Neurological: Normal speech and language. Moves all extremities to command. No gross focal neurologic deficits are appreciated.  Glascow Coma Score: 4 - Opens eyes on own 6 - Follows simple motor  commands 4 - Seems confused, disoriented GCS: 14   ____________________________________________   LABS (all labs ordered are listed, but only abnormal results are displayed)  Labs Reviewed - No data to display ____________________________________________  EKG  none ____________________________________________  RADIOLOGY  XR hip and lumbar spine: No acute findings  CT head: negative  ____________________________________________   PROCEDURES  Procedure(s) performed: None Procedures Critical Care performed:  None ____________________________________________   INITIAL IMPRESSION / ASSESSMENT AND PLAN / ED COURSE   83 y.o. female who presents from [place] by EMS status post mechanical fall. Patient was complaining of buttock pain at the facility which prompted her visit to the emergency room. At this time she has no pain. Physical exam with no bruising, deformities, or or tenderness. No signs or symptoms o basilar skull fracture. Vital signs are within normal limits. Fall was witnessed with no head trauma or LOC. Patient is not in any blood thinners. We'll get x-ray of her lumbar and pelvis region as she was complaining of pain at the facility.    ED COURSE:  Patient nonambulatory at baseline and uses wheelchair, she is able to transition and is able to do it here with no pain. XR and CT head with no abnormalities. Patient will be discharged back to [facility].  Pertinent labs & imaging results that were available during my care of the patient were reviewed by me and considered in my medical decision making (see chart for details).    ____________________________________________   FINAL CLINICAL IMPRESSION(S) / ED DIAGNOSES  Final diagnoses:  Fall, initial encounter      NEW MEDICATIONS STARTED DURING THIS VISIT:  New Prescriptions   No medications on file     Note:  This document was prepared using Dragon voice recognition software and may include unintentional  dictation errors.    Nita Sickle, MD 09/21/16 [phone]

## 2016-09-21 NOTE — ED Notes (Signed)
Pt assisted x2 to standing position - she does not understand the concept of taking steps - Dr Don PerkingVeronese notified - pt had urinated on self - changed clothing and linen

## 2016-09-21 NOTE — Discharge Instructions (Signed)

## 2016-09-21 NOTE — ED Triage Notes (Signed)
Pt arrived via ems - it is reported that she fell while on the toilet - pt denies this but does have a hx of dementia - pt denies any pain at this time but reported to nursing home ([facility]) staff that her tailbone hurt

## 2016-09-21 NOTE — ED Notes (Signed)
Secretary notified to call ems for transfer back to [facility]

## 2018-10-14 ENCOUNTER — Encounter: Payer: Self-pay | Admitting: Emergency Medicine

## 2018-10-14 ENCOUNTER — Other Ambulatory Visit: Payer: Self-pay

## 2018-10-14 ENCOUNTER — Emergency Department: Payer: Medicare Other

## 2018-10-14 ENCOUNTER — Emergency Department
Admission: EM | Admit: 2018-10-14 | Discharge: 2018-10-14 | Disposition: A | Discharge status: Home / Self Care | Payer: Medicare Other | Attending: Emergency Medicine | Admitting: Emergency Medicine

## 2018-10-14 DIAGNOSIS — Z96641 Presence of right artificial hip joint: Secondary | ICD-10-CM | POA: Insufficient documentation

## 2018-10-14 DIAGNOSIS — Z7982 Long term (current) use of aspirin: Secondary | ICD-10-CM | POA: Insufficient documentation

## 2018-10-14 DIAGNOSIS — I2510 Atherosclerotic heart disease of native coronary artery without angina pectoris: Secondary | ICD-10-CM | POA: Diagnosis not present

## 2018-10-14 DIAGNOSIS — F0390 Unspecified dementia without behavioral disturbance: Secondary | ICD-10-CM | POA: Diagnosis not present

## 2018-10-14 DIAGNOSIS — I509 Heart failure, unspecified: Secondary | ICD-10-CM | POA: Diagnosis not present

## 2018-10-14 DIAGNOSIS — I110 Hypertensive heart disease with heart failure: Secondary | ICD-10-CM | POA: Diagnosis not present

## 2018-10-14 DIAGNOSIS — E039 Hypothyroidism, unspecified: Secondary | ICD-10-CM | POA: Diagnosis not present

## 2018-10-14 DIAGNOSIS — Y939 Activity, unspecified: Secondary | ICD-10-CM | POA: Insufficient documentation

## 2018-10-14 DIAGNOSIS — Z79899 Other long term (current) drug therapy: Secondary | ICD-10-CM | POA: Diagnosis not present

## 2018-10-14 DIAGNOSIS — Y999 Unspecified external cause status: Secondary | ICD-10-CM | POA: Insufficient documentation

## 2018-10-14 DIAGNOSIS — Y92129 Unspecified place in nursing home as the place of occurrence of the external cause: Secondary | ICD-10-CM | POA: Insufficient documentation

## 2018-10-14 DIAGNOSIS — W19XXXA Unspecified fall, initial encounter: Secondary | ICD-10-CM | POA: Insufficient documentation

## 2018-10-14 NOTE — Discharge Instructions (Addendum)
Please return as needed.  Please be careful when you are walking.

## 2018-10-14 NOTE — ED Notes (Signed)
Pt voicing negative verbalizations. This RN asked patient if she was okay, pt states she is waiting for her husband who isn't going to come get her. This RN explained she was waiting on EMS transport to come get her. Pt once again resting in bed.

## 2018-10-14 NOTE — ED Notes (Signed)
Pt transported to CT at this time. Yellow socks and yellow fall risk bracelet placed. Pt with good visual view as patient is in [facility] in front of nurses station.

## 2018-10-14 NOTE — ED Triage Notes (Signed)
Pt presents to ED via [facility] with c/o unwitnessed fall from [place] [address] Per EMS pt was found on the floor, pt has no new bruising, old bruising noted to R elbow and R posterior head. Pt denies pain with palpation of spine, head, arms, legs. Pt c/o some soreness to R elbow with noted bruising. Per EMS pt with unsteady gait.

## 2018-10-14 NOTE — ED Provider Notes (Signed)
[facility] Emergency Department Provider Note   ____________________________________________   First MD Initiated Contact with Patient 10/14/18 1335     (approximate)  I have reviewed the triage vital signs and the nursing notes.   HISTORY  Chief Complaint Fall   HPI Gloria Martinez is a 85 y.o. female with an unsteady gait who was found on the floor.  She has a history of frequent falls.  Patient does not remember exactly what happened.  She says nothing hurts her.  She has old bruising on her right arm and elbow but her right arm and elbow work normally.  She has a little bruise on her head but says it does not hurt.   Past Medical History:  Diagnosis Date  . Anxiety   . CHF (congestive heart failure) (HCC)   . Coronary artery disease   . Dementia (HCC)   . Diverticulitis   . Dysphagia   . Epilepsy (HCC)   . Glaucoma   . Hyperlipidemia   . Hypertension   . Hypothyroidism   . Muscle weakness     Patient Active Problem List   Diagnosis Date Noted  . Hip fracture (HCC) 08/09/2013    Past Surgical History:  Procedure Laterality Date  . ABDOMINAL HYSTERECTOMY    . Hip Replacement  Right 2009    Prior to Admission medications   Medication Sig Start Date End Date Taking? Authorizing Provider  acetaminophen (TYLENOL) 325 MG tablet Take 650 mg by mouth every 6 (six) hours as needed.   Yes [provider]  amLODipine (NORVASC) 5 MG tablet Take 5 mg by mouth daily.   Yes [provider]  aspirin EC 81 MG tablet Take 81 mg by mouth daily.   Yes [provider]  Cholecalciferol (D3-1000) 1000 units tablet Take 2,000 Units by mouth daily.    Yes [provider]  citalopram (CELEXA) 10 MG tablet Take 10 mg by mouth daily.   Yes [provider]  furosemide (LASIX) 20 MG tablet Take 20 mg by mouth daily.   Yes [provider]  ipratropium (ATROVENT) 0.03 % nasal spray Place 2 sprays into both  nostrils 3 (three) times daily as needed for congestion. 09/01/18  Yes [provider]  latanoprost (XALATAN) 0.005 % ophthalmic solution Place 1 drop into the right eye at bedtime.    Yes [provider]  levothyroxine (SYNTHROID, LEVOTHROID) 100 MCG tablet Take 100 mcg by mouth daily before breakfast.    Yes [provider]  Melatonin 3 MG TABS Take 3 mg by mouth at bedtime.   Yes [provider]  metoprolol succinate (TOPROL-XL) 100 MG 24 hr tablet Take 100 mg by mouth daily. Take with or immediately following a meal.   Yes [provider]  Polyethyl Glycol-Propyl Glycol (LUBRICANT EYE DROPS) 0.4-0.3 % SOLN Place 1 drop into both eyes 2 (two) times daily.   Yes [provider]  polyethylene glycol (MIRALAX / GLYCOLAX) packet Take 17 g by mouth every other day.   Yes [provider]  POTASSIUM CHLORIDE PO Take 10 mEq by mouth daily.   Yes [provider]  senna-docusate (SENOKOT-S) 8.6-50 MG per tablet Take 1 tablet by mouth 2 (two) times daily.   Yes [provider]  traZODone (DESYREL) 50 MG tablet Take 25 mg by mouth at bedtime.   Yes [provider]    Allergies Ciprofloxacin; Macrobid [nitrofurantoin monohyd macro]; Penicillins; Percocet [oxycodone-acetaminophen]; Sulfa antibiotics; Tussionex pennkinetic  er [hydrocod polst-cpm polst er]; and Vicodin [hydrocodone-acetaminophen]  No family history on file.  Social History Social History   Tobacco Use  . Smoking status: Never Smoker  . Smokeless tobacco: Never Used  Substance Use Topics  . Alcohol use: No  . Drug use: No    Review of Systems  Constitutional: No fever/chills Eyes: No visual changes. ENT: No sore throat. Cardiovascular: Denies chest pain. Respiratory: Denies shortness of breath. Gastrointestinal: No abdominal pain.  No nausea, no vomiting.  No diarrhea.  No constipation. Genitourinary: Negative for dysuria. Musculoskeletal:  Negative for back pain. Skin: Negative for rash. Neurological: Negative for headaches, focal weakness   ____________________________________________   PHYSICAL EXAM:  VITAL SIGNS: ED Triage Vitals  Enc Vitals Group     BP      Pulse      Resp      Temp      Temp src      SpO2      Weight      Height      Head Circumference      Peak Flow      Pain Score      Pain Loc      Pain Edu?      Excl. in GC?     Constitutional: Alert and oriented. Well appearing and in no acute distress. Eyes: Conjunctivae are normal.  Head: Atraumatic. Nose: No congestion/rhinnorhea. Mouth/Throat: Mucous membranes are moist.  Oropharynx non-erythematous. Neck: No stridor. Cardiovascular: Normal rate, regular rhythm. Grossly normal heart sounds.  Good peripheral circulation. Respiratory: Normal respiratory effort.  No retractions. Lungs CTAB. Gastrointestinal: Soft and nontender. No distention. No abdominal bruits. No CVA tenderness. Musculoskeletal: No lower extremity tenderness nor edema.  All joints were normally there is no tenderness in the legs knees hips pelvis abdomen chest ribs or arms or actually not even in the neck.  Has seen a number of people with neck fractures with no tenderness there is still CT that anyway. Neurologic:  Normal speech and language. No gross focal neurologic deficits are appreciated.\ Skin:  Skin is warm, dry and intact. No rash noted.  This is as noted in HPI Psychiatric: Mood and affect are normal. Speech and behavior are normal.  ____________________________________________   LABS (all labs ordered are listed, but only abnormal results are displayed)  Labs Reviewed - No data to display ____________________________________________  EKG   ____________________________________________  RADIOLOGY  ED MD interpretation:   Official radiology report(s): Ct Head Wo Contrast  Result Date: 10/14/2018 CLINICAL DATA:  Recent fall EXAM: CT HEAD WITHOUT  CONTRAST CT CERVICAL SPINE WITHOUT CONTRAST TECHNIQUE: Multidetector CT imaging of the head and cervical spine was performed following the standard protocol without intravenous contrast. Multiplanar CT image reconstructions of the cervical spine were also generated. COMPARISON:  09/21/2016 FINDINGS: CT HEAD FINDINGS Brain: Chronic atrophic and ischemic changes are noted similar to that seen on the prior exam. No findings to suggest acute hemorrhage, acute infarction or space-occupying mass lesion are noted. Small calcified meningioma is again noted just posterior to the internal auditory canal on the left. Vascular: No hyperdense vessel or unexpected calcification. Skull: Normal. Negative for fracture or focal lesion. Sinuses/Orbits: No acute finding. Other: Right mastoid effusion is again noted. CT CERVICAL SPINE FINDINGS Alignment: Mild anterolisthesis of C4 on C5 of a degenerative nature is noted. Skull base and vertebrae: 7 cervical segments are well visualized. Vertebral body height is well maintained. Multilevel facet hypertrophic changes and osteophytic changes are seen.  No acute fracture or acute facet abnormality is noted. Soft tissues and spinal canal: Surrounding soft tissues are within normal limits. Heavy carotid calcifications are noted bilaterally. Upper chest: Mild emphysematous changes are noted in the lung apices. No acute abnormality is noted. Other: None IMPRESSION: CT of the head: Chronic atrophic and ischemic changes without acute abnormality. Chronic changes as described above. CT of the cervical spine: Multilevel degenerative changes without acute abnormality. Electronically Signed   By: Alcide Clever M.D.   On: 10/14/2018 14:32   Ct Cervical Spine Wo Contrast  Result Date: 10/14/2018 CLINICAL DATA:  Recent fall EXAM: CT HEAD WITHOUT CONTRAST CT CERVICAL SPINE WITHOUT CONTRAST TECHNIQUE: Multidetector CT imaging of the head and cervical spine was performed following the standard protocol  without intravenous contrast. Multiplanar CT image reconstructions of the cervical spine were also generated. COMPARISON:  09/21/2016 FINDINGS: CT HEAD FINDINGS Brain: Chronic atrophic and ischemic changes are noted similar to that seen on the prior exam. No findings to suggest acute hemorrhage, acute infarction or space-occupying mass lesion are noted. Small calcified meningioma is again noted just posterior to the internal auditory canal on the left. Vascular: No hyperdense vessel or unexpected calcification. Skull: Normal. Negative for fracture or focal lesion. Sinuses/Orbits: No acute finding. Other: Right mastoid effusion is again noted. CT CERVICAL SPINE FINDINGS Alignment: Mild anterolisthesis of C4 on C5 of a degenerative nature is noted. Skull base and vertebrae: 7 cervical segments are well visualized. Vertebral body height is well maintained. Multilevel facet hypertrophic changes and osteophytic changes are seen. No acute fracture or acute facet abnormality is noted. Soft tissues and spinal canal: Surrounding soft tissues are within normal limits. Heavy carotid calcifications are noted bilaterally. Upper chest: Mild emphysematous changes are noted in the lung apices. No acute abnormality is noted. Other: None IMPRESSION: CT of the head: Chronic atrophic and ischemic changes without acute abnormality. Chronic changes as described above. CT of the cervical spine: Multilevel degenerative changes without acute abnormality. Electronically Signed   By: Alcide Clever M.D.   On: 10/14/2018 14:32    ____________________________________________   PROCEDURES  Procedure(s) performed (including Critical Care):  Procedures   ____________________________________________   INITIAL IMPRESSION / ASSESSMENT AND PLAN / ED COURSE            ____________________________________________   FINAL CLINICAL IMPRESSION(S) / ED DIAGNOSES  Final diagnoses:  Fall, initial encounter     ED Discharge  Orders    None       Note:  This document was prepared using Dragon voice recognition software and may include unintentional dictation errors.    Arnaldo Natal, MD 10/14/18 [id number]

## 2018-10-14 NOTE — ED Notes (Signed)
NAD noted at time of D/C. Unable to obtain E-sig due to patient having dementia, pt denies any pain at this time. This RN reviewed D/C orders and sent packet back to facility with patient.

## 2018-10-14 NOTE — ED Notes (Signed)
This RN spoke with Tiffany from the [employer], handoff given and explained that patient would be returning. When asked if they had transportation, per Tiffany, no transportation on the weekends.

## 2018-10-16 ENCOUNTER — Emergency Department: Payer: Medicare Other

## 2018-10-16 ENCOUNTER — Other Ambulatory Visit: Payer: Self-pay

## 2018-10-16 ENCOUNTER — Emergency Department
Admission: EM | Admit: 2018-10-16 | Discharge: 2018-10-16 | Disposition: A | Discharge status: Skilled Nursing Facility | Payer: Medicare Other | Attending: Emergency Medicine | Admitting: Emergency Medicine

## 2018-10-16 DIAGNOSIS — Y999 Unspecified external cause status: Secondary | ICD-10-CM | POA: Insufficient documentation

## 2018-10-16 DIAGNOSIS — Z96641 Presence of right artificial hip joint: Secondary | ICD-10-CM | POA: Diagnosis not present

## 2018-10-16 DIAGNOSIS — S59901A Unspecified injury of right elbow, initial encounter: Secondary | ICD-10-CM | POA: Diagnosis present

## 2018-10-16 DIAGNOSIS — F0390 Unspecified dementia without behavioral disturbance: Secondary | ICD-10-CM | POA: Diagnosis not present

## 2018-10-16 DIAGNOSIS — Y92129 Unspecified place in nursing home as the place of occurrence of the external cause: Secondary | ICD-10-CM | POA: Insufficient documentation

## 2018-10-16 DIAGNOSIS — I509 Heart failure, unspecified: Secondary | ICD-10-CM | POA: Insufficient documentation

## 2018-10-16 DIAGNOSIS — S5001XA Contusion of right elbow, initial encounter: Secondary | ICD-10-CM | POA: Diagnosis not present

## 2018-10-16 DIAGNOSIS — N3000 Acute cystitis without hematuria: Secondary | ICD-10-CM

## 2018-10-16 DIAGNOSIS — Y939 Activity, unspecified: Secondary | ICD-10-CM | POA: Diagnosis not present

## 2018-10-16 DIAGNOSIS — I110 Hypertensive heart disease with heart failure: Secondary | ICD-10-CM | POA: Diagnosis not present

## 2018-10-16 DIAGNOSIS — W19XXXA Unspecified fall, initial encounter: Secondary | ICD-10-CM | POA: Diagnosis not present

## 2018-10-16 DIAGNOSIS — N390 Urinary tract infection, site not specified: Secondary | ICD-10-CM | POA: Insufficient documentation

## 2018-10-16 DIAGNOSIS — E039 Hypothyroidism, unspecified: Secondary | ICD-10-CM | POA: Insufficient documentation

## 2018-10-16 DIAGNOSIS — I2510 Atherosclerotic heart disease of native coronary artery without angina pectoris: Secondary | ICD-10-CM | POA: Diagnosis not present

## 2018-10-16 DIAGNOSIS — S20212A Contusion of left front wall of thorax, initial encounter: Secondary | ICD-10-CM | POA: Diagnosis not present

## 2018-10-16 LAB — CBC WITH DIFFERENTIAL/PLATELET
Abs Immature Granulocytes: 0.06 10*3/uL (ref 0.00–0.07)
Basophils Absolute: 0.1 10*3/uL (ref 0.0–0.1)
Basophils Relative: 1 %
Eosinophils Absolute: 0.3 10*3/uL (ref 0.0–0.5)
Eosinophils Relative: 3 %
HCT: 42.1 % (ref 36.0–46.0)
Hemoglobin: 14.1 g/dL (ref 12.0–15.0)
Immature Granulocytes: 1 %
Lymphocytes Relative: 25 %
Lymphs Abs: 2.9 10*3/uL (ref 0.7–4.0)
MCH: 31.4 pg (ref 26.0–34.0)
MCHC: 33.5 g/dL (ref 30.0–36.0)
MCV: 93.8 fL (ref 80.0–100.0)
Monocytes Absolute: 1 10*3/uL (ref 0.1–1.0)
Monocytes Relative: 9 %
Neutro Abs: 7.2 10*3/uL (ref 1.7–7.7)
Neutrophils Relative %: 61 %
Platelets: 351 10*3/uL (ref 150–400)
RBC: 4.49 MIL/uL (ref 3.87–5.11)
RDW: 14.4 % (ref 11.5–15.5)
WBC: 11.5 10*3/uL — ABNORMAL HIGH (ref 4.0–10.5)
nRBC: 0 % (ref 0.0–0.2)

## 2018-10-16 LAB — COMPREHENSIVE METABOLIC PANEL
ALT: 15 U/L (ref 0–44)
AST: 30 U/L (ref 15–41)
Albumin: 4 g/dL (ref 3.5–5.0)
Alkaline Phosphatase: 70 U/L (ref 38–126)
Anion gap: 10 (ref 5–15)
BUN: 38 mg/dL — ABNORMAL HIGH (ref 8–23)
CO2: 21 mmol/L — ABNORMAL LOW (ref 22–32)
Calcium: 8.5 mg/dL — ABNORMAL LOW (ref 8.9–10.3)
Chloride: 105 mmol/L (ref 98–111)
Creatinine, Ser: 1.25 mg/dL — ABNORMAL HIGH (ref 0.44–1.00)
GFR calc Af Amer: 42 mL/min — ABNORMAL LOW (ref >60)
GFR calc non Af Amer: 36 mL/min — ABNORMAL LOW (ref >60)
Glucose, Bld: 113 mg/dL — ABNORMAL HIGH (ref 70–99)
Potassium: 4 mmol/L (ref 3.5–5.1)
Sodium: 136 mmol/L (ref 135–145)
TOTAL PROTEIN: 7.4 g/dL (ref 6.5–8.1)
Total Bilirubin: 1 mg/dL (ref 0.3–1.2)

## 2018-10-16 LAB — URINALYSIS, COMPLETE (UACMP) WITH MICROSCOPIC
Bilirubin Urine: NEGATIVE
Glucose, UA: NEGATIVE mg/dL
Ketones, ur: NEGATIVE mg/dL
Nitrite: POSITIVE — AB
PH: 7 (ref 5.0–8.0)
Protein, ur: NEGATIVE mg/dL
Specific Gravity, Urine: 1.016 (ref 1.005–1.030)

## 2018-10-16 MED ORDER — SODIUM CHLORIDE 0.9 % IV SOLN
1.0000 g | Freq: Once | INTRAVENOUS | Status: AC
  Administered 2018-10-16: 1 g via INTRAVENOUS
  Filled 2018-10-16: qty 10

## 2018-10-16 MED ORDER — ACETAMINOPHEN 500 MG PO TABS
1000.0000 mg | ORAL_TABLET | Freq: Once | ORAL | Status: AC
  Administered 2018-10-16: 1000 mg via ORAL
  Filled 2018-10-16: qty 2

## 2018-10-16 MED ORDER — CEPHALEXIN 500 MG PO CAPS: 500.0000 mg | ORAL_CAPSULE | Freq: Four times a day (QID) | ORAL | 0 refills | Status: AC

## 2018-10-16 NOTE — ED Notes (Signed)
Patient transported to X-ray 

## 2018-10-16 NOTE — ED Notes (Signed)
Called [facility] for discharge transport of patient.  [place] states there is no transportation available.

## 2018-10-16 NOTE — ED Provider Notes (Signed)
[facility] Emergency Department Provider Note       Time seen: ----------------------------------------- 5:56 PM on 10/16/2018 ----------------------------------------- Level V caveat: History/ROS limited by dementia  I have reviewed the triage vital signs and the nursing notes.  HISTORY   Chief Complaint No chief complaint on file.    HPI Gloria Martinez is a 85 y.o. female with a history of anxiety, CHF, coronary artery disease, dementia, hyperlipidemia, hypertension who presents to the ED for fall.  Patient reportedly fell 2 days ago and had outpatient CT head and C-spine performed which was unremarkable.  Now she is complaining of right elbow, hip and left rib pain.  No further review of systems is available.  Past Medical History:  Diagnosis Date  . Anxiety   . CHF (congestive heart failure) (HCC)   . Coronary artery disease   . Dementia (HCC)   . Diverticulitis   . Dysphagia   . Epilepsy (HCC)   . Glaucoma   . Hyperlipidemia   . Hypertension   . Hypothyroidism   . Muscle weakness     Patient Active Problem List   Diagnosis Date Noted  . Hip fracture (HCC) 08/09/2013    Past Surgical History:  Procedure Laterality Date  . ABDOMINAL HYSTERECTOMY    . Hip Replacement  Right 2009    Allergies Ciprofloxacin; Macrobid [nitrofurantoin monohyd macro]; Penicillins; Percocet [oxycodone-acetaminophen]; Sulfa antibiotics; Tussionex pennkinetic er [employer] polst-cpm polst er]; and Vicodin [hydrocodone-acetaminophen]  Social History Social History   Tobacco Use  . Smoking status: Never Smoker  . Smokeless tobacco: Never Used  Substance Use Topics  . Alcohol use: No  . Drug use: No   Review of Systems  Musculoskeletal: Positive for right elbow, left rib and hip pain Skin: Positive for right elbow contusion Neurological: Negative for headaches currently  All systems negative/normal/unremarkable except as stated in the  HPI  ____________________________________________   PHYSICAL EXAM:  VITAL SIGNS: ED Triage Vitals  Enc Vitals Group     BP      Pulse      Resp      Temp      Temp src      SpO2      Weight      Height      Head Circumference      Peak Flow      Pain Score      Pain Loc      Pain Edu?      Excl. in GC?     Constitutional: Alert but disoriented. Well appearing and in no distress. Eyes: Conjunctivae are normal. Normal extraocular movements. ENT      Head: Normocephalic and atraumatic.      Nose: No congestion/rhinnorhea.      Mouth/Throat: Mucous membranes are moist.      Neck: No stridor. Cardiovascular: Normal rate, regular rhythm. No murmurs, rubs, or gallops. Respiratory: Normal respiratory effort without tachypnea nor retractions. Breath sounds are clear and equal bilaterally. No wheezes/rales/rhonchi. Gastrointestinal: Soft and nontender. Normal bowel sounds Musculoskeletal: Tenderness with effusion over the right elbow and extensive ecchymosis.  Specific left inferior axillary rib tenderness.  No apparent pain with range of motion of the legs Neurologic:  Normal speech and language. No gross focal neurologic deficits are appreciated.  Skin: Right elbow and proximal forearm ecchymosis dorsally Psychiatric: Mood and affect are normal. Speech and behavior are normal.  ____________________________________________  ED COURSE:  As part of my medical decision making, I reviewed the  following data within the electronic MEDICAL RECORD NUMBER History obtained from family if available, nursing notes, old chart and ekg, as well as notes from prior ED visits. Patient presented for mechanical fall, we will assess with labs and imaging as indicated at this time.   Procedures ____________________________________________   LABS (pertinent positives/negatives)  Labs Reviewed  CBC WITH DIFFERENTIAL/PLATELET - Abnormal; Notable for the following components:      Result Value   WBC  11.5 (*)    All other components within normal limits  COMPREHENSIVE METABOLIC PANEL - Abnormal; Notable for the following components:   CO2 21 (*)    Glucose, Bld 113 (*)    BUN 38 (*)    Creatinine, Ser 1.25 (*)    Calcium 8.5 (*)    GFR calc non Af Amer 36 (*)    GFR calc Af Amer 42 (*)    All other components within normal limits  URINALYSIS, COMPLETE (UACMP) WITH MICROSCOPIC - Abnormal; Notable for the following components:   Color, Urine YELLOW (*)    APPearance CLEAR (*)    Hgb urine dipstick SMALL (*)    Nitrite POSITIVE (*)    Leukocytes,Ua MODERATE (*)    Bacteria, UA RARE (*)    All other components within normal limits  URINE CULTURE  CBG MONITORING, ED   EKG: Interpreted by me, sinus rhythm rate of 72 bpm, prolonged PR interval, normal axis, normal QT  RADIOLOGY Images were viewed by me  Left rib x-rays, right elbow x-rays, pelvis x-ray IMPRESSION: 1. No acute fracture or pneumothorax. 2. Remote left-sided rib fractures are stable. 3. Chronic interstitial lung disease. 4. Aortic atherosclerosis. ____________________________________________   DIFFERENTIAL DIAGNOSIS   Fall, contusion, fracture, dislocation, dehydration, occult infection  FINAL ASSESSMENT AND PLAN  Fall, right elbow contusion, rib contusion, UTI   Plan: The patient had presented for recent fall. Patient's labs did reveal significant urinary tract infection.  Have sent a urine culture, I gave her IV Rocephin 1 g.  Will be discharged with oral antibiotics.  Patient's imaging did not reveal any acute process.  She is cleared for outpatient follow-up.   Ulice Dash, MD    Note: This note was generated in part or whole with voice recognition software. Voice recognition is usually quite accurate but there are transcription errors that can and very often do occur. I apologize for any typographical errors that were not detected and corrected.     Emily Filbert, MD 10/16/18  2100

## 2018-10-16 NOTE — ED Triage Notes (Signed)
Pt BIB [facility] from [facility] [address] Pt has been c/o of ongoing back , hip and left rib pain following a fall on Saturday. PT was evaluated here following the fall. Facility states pt has been less mobile/ active than normal mildly confused and smells of UTI. VSS, afebrile.

## 2018-10-16 NOTE — ED Notes (Signed)
Report called to Blinda Leatherwood, Charity fundraiser at [facility]. Pt transported back to facility via [facility]. Pt unable to sign for herself r/t Dementia.

## 2018-10-19 LAB — URINE CULTURE

## 2019-03-24 ENCOUNTER — Emergency Department: Payer: Medicare Other

## 2019-03-24 ENCOUNTER — Inpatient Hospital Stay
Admission: EM | Admit: 2019-03-24 | Discharge: 2019-03-27 | DRG: 535 | Disposition: A | Discharge status: Skilled Nursing Facility | Payer: Medicare Other | Attending: Specialist | Admitting: Specialist

## 2019-03-24 ENCOUNTER — Other Ambulatory Visit: Payer: Self-pay

## 2019-03-24 DIAGNOSIS — Z7982 Long term (current) use of aspirin: Secondary | ICD-10-CM

## 2019-03-24 DIAGNOSIS — Z880 Allergy status to penicillin: Secondary | ICD-10-CM | POA: Diagnosis not present

## 2019-03-24 DIAGNOSIS — Z79890 Hormone replacement therapy (postmenopausal): Secondary | ICD-10-CM

## 2019-03-24 DIAGNOSIS — G40909 Epilepsy, unspecified, not intractable, without status epilepticus: Secondary | ICD-10-CM | POA: Diagnosis present

## 2019-03-24 DIAGNOSIS — Z66 Do not resuscitate: Secondary | ICD-10-CM | POA: Diagnosis present

## 2019-03-24 DIAGNOSIS — Z885 Allergy status to narcotic agent status: Secondary | ICD-10-CM | POA: Diagnosis not present

## 2019-03-24 DIAGNOSIS — Y92129 Unspecified place in nursing home as the place of occurrence of the external cause: Secondary | ICD-10-CM | POA: Diagnosis not present

## 2019-03-24 DIAGNOSIS — Z881 Allergy status to other antibiotic agents status: Secondary | ICD-10-CM | POA: Diagnosis not present

## 2019-03-24 DIAGNOSIS — Z888 Allergy status to other drugs, medicaments and biological substances status: Secondary | ICD-10-CM | POA: Diagnosis not present

## 2019-03-24 DIAGNOSIS — Z96641 Presence of right artificial hip joint: Secondary | ICD-10-CM | POA: Diagnosis present

## 2019-03-24 DIAGNOSIS — S0083XA Contusion of other part of head, initial encounter: Secondary | ICD-10-CM | POA: Diagnosis present

## 2019-03-24 DIAGNOSIS — H409 Unspecified glaucoma: Secondary | ICD-10-CM | POA: Diagnosis present

## 2019-03-24 DIAGNOSIS — E039 Hypothyroidism, unspecified: Secondary | ICD-10-CM | POA: Diagnosis present

## 2019-03-24 DIAGNOSIS — S32401A Unspecified fracture of right acetabulum, initial encounter for closed fracture: Secondary | ICD-10-CM | POA: Diagnosis present

## 2019-03-24 DIAGNOSIS — I10 Essential (primary) hypertension: Secondary | ICD-10-CM | POA: Diagnosis present

## 2019-03-24 DIAGNOSIS — Z882 Allergy status to sulfonamides status: Secondary | ICD-10-CM | POA: Diagnosis not present

## 2019-03-24 DIAGNOSIS — S329XXA Fracture of unspecified parts of lumbosacral spine and pelvis, initial encounter for closed fracture: Secondary | ICD-10-CM | POA: Diagnosis present

## 2019-03-24 DIAGNOSIS — S32591A Other specified fracture of right pubis, initial encounter for closed fracture: Principal | ICD-10-CM | POA: Diagnosis present

## 2019-03-24 DIAGNOSIS — W19XXXA Unspecified fall, initial encounter: Secondary | ICD-10-CM | POA: Diagnosis present

## 2019-03-24 DIAGNOSIS — Z90710 Acquired absence of both cervix and uterus: Secondary | ICD-10-CM

## 2019-03-24 DIAGNOSIS — I5032 Chronic diastolic (congestive) heart failure: Secondary | ICD-10-CM | POA: Diagnosis present

## 2019-03-24 DIAGNOSIS — I2510 Atherosclerotic heart disease of native coronary artery without angina pectoris: Secondary | ICD-10-CM | POA: Diagnosis present

## 2019-03-24 DIAGNOSIS — F0390 Unspecified dementia without behavioral disturbance: Secondary | ICD-10-CM | POA: Diagnosis present

## 2019-03-24 DIAGNOSIS — R102 Pelvic and perineal pain: Secondary | ICD-10-CM

## 2019-03-24 DIAGNOSIS — F419 Anxiety disorder, unspecified: Secondary | ICD-10-CM | POA: Diagnosis present

## 2019-03-24 DIAGNOSIS — E785 Hyperlipidemia, unspecified: Secondary | ICD-10-CM | POA: Diagnosis present

## 2019-03-24 DIAGNOSIS — I110 Hypertensive heart disease with heart failure: Secondary | ICD-10-CM | POA: Diagnosis present

## 2019-03-24 DIAGNOSIS — F329 Major depressive disorder, single episode, unspecified: Secondary | ICD-10-CM | POA: Diagnosis present

## 2019-03-24 DIAGNOSIS — Z20828 Contact with and (suspected) exposure to other viral communicable diseases: Secondary | ICD-10-CM | POA: Diagnosis present

## 2019-03-24 DIAGNOSIS — Z79899 Other long term (current) drug therapy: Secondary | ICD-10-CM

## 2019-03-24 LAB — COMPREHENSIVE METABOLIC PANEL
ALT: 14 U/L (ref 0–44)
AST: 23 U/L (ref 15–41)
Albumin: 3.7 g/dL (ref 3.5–5.0)
Alkaline Phosphatase: 73 U/L (ref 38–126)
Anion gap: 10 (ref 5–15)
BUN: 20 mg/dL (ref 8–23)
CO2: 19 mmol/L — ABNORMAL LOW (ref 22–32)
Calcium: 9 mg/dL (ref 8.9–10.3)
Chloride: 107 mmol/L (ref 98–111)
Creatinine, Ser: 1.07 mg/dL — ABNORMAL HIGH (ref 0.44–1.00)
GFR calc Af Amer: 50 mL/min — ABNORMAL LOW (ref >60)
GFR calc non Af Amer: 43 mL/min — ABNORMAL LOW (ref >60)
Glucose, Bld: 116 mg/dL — ABNORMAL HIGH (ref 70–99)
Potassium: 3.8 mmol/L (ref 3.5–5.1)
Sodium: 136 mmol/L (ref 135–145)
Total Bilirubin: 0.8 mg/dL (ref 0.3–1.2)
Total Protein: 7 g/dL (ref 6.5–8.1)

## 2019-03-24 LAB — CBC
HCT: 42.2 % (ref 36.0–46.0)
Hemoglobin: 14.1 g/dL (ref 12.0–15.0)
MCH: 31.2 pg (ref 26.0–34.0)
MCHC: 33.4 g/dL (ref 30.0–36.0)
MCV: 93.4 fL (ref 80.0–100.0)
Platelets: 320 10*3/uL (ref 150–400)
RBC: 4.52 MIL/uL (ref 3.87–5.11)
RDW: 14.5 % (ref 11.5–15.5)
WBC: 9.9 10*3/uL (ref 4.0–10.5)
nRBC: 0 % (ref 0.0–0.2)

## 2019-03-24 MED ORDER — FENTANYL CITRATE (PF) 100 MCG/2ML IJ SOLN
25.0000 ug | Freq: Once | INTRAMUSCULAR | Status: AC
  Administered 2019-03-24: 25 ug via INTRAVENOUS
  Filled 2019-03-24: qty 2

## 2019-03-24 NOTE — ED Notes (Addendum)
ED TO INPATIENT HANDOFF REPORT  ED Nurse Name and Phone #: Geraldine ContrasDee [phone]  S Name/Age/Gender Gloria Martinez 85 y.o. female Room/Bed: ED03A/ED03A  Code Status   Code Status: Prior  Home/SNF/Other Nursing Home Patient oriented to: self and place Is this baseline? Yes   Triage Complete: Triage complete  Chief Complaint [facility] Fall  Triage Note Pt arrived via [facility] from [facility] r/t a fall. Pt was found on the bathroom floor by staff. Pt is not on blood thinners but has a hematoma on the right side of the head and internal rotation of the right hip. Pt was given fentanyl by EMS staff.   Allergies Allergies  Allergen Reactions  . Ciprofloxacin   . Macrobid [employer][Nitrofurantoin Monohyd Macro]   . Penicillins   . Percocet [Oxycodone-Acetaminophen]   . Sulfa Antibiotics   . Tussionex Pennkinetic Er [Hydrocod Polst-Cpm Polst Er]   . Vicodin [Hydrocodone-Acetaminophen]     Level of Care/Admitting Diagnosis ED Disposition    ED Disposition Condition Comment   Admit  The patient appears reasonably stabilized for admission considering the current resources, flow, and capabilities available in the ED at this time, and I doubt any other [facility] requiring further screening and/or treatment in the ED prior to admission is  present.       B Medical/Surgery History Past Medical History:  Diagnosis Date  . Anxiety   . CHF (congestive heart failure) (HCC)   . Coronary artery disease   . Dementia (HCC)   . Diverticulitis   . Dysphagia   . Epilepsy (HCC)   . Glaucoma   . Hyperlipidemia   . Hypertension   . Hypothyroidism   . Muscle weakness    Past Surgical History:  Procedure Laterality Date  . ABDOMINAL HYSTERECTOMY    . Hip Replacement  Right 2009     A IV Location/Drains/Wounds Patient Lines/Drains/Airways Status   Active Line/Drains/Airways    Name:   Placement date:   Placement time:   Site:   Days:   Peripheral IV 03/24/19 Left Forearm   03/24/19    2113     Forearm   less than 1   Wound 08/09/13 Abrasion(s) Elbow Right   08/09/13    0612    Elbow   2053          Intake/Output Last 24 hours No intake or output data in the 24 hours ending 03/24/19 2323  Labs/Imaging Results for orders placed or performed during the hospital encounter of 03/24/19 (from the past 48 hour(s))  CBC     Status: None   Collection Time: 03/24/19  9:15 PM  Result Value Ref Range   WBC 9.9 4.0 - 10.5 K/uL   RBC 4.52 3.87 - 5.11 MIL/uL   Hemoglobin 14.1 12.0 - 15.0 g/dL   HCT 40.942.2 81.136.0 - 91.446.0 %   MCV 93.4 80.0 - 100.0 fL   MCH 31.2 26.0 - 34.0 pg   MCHC 33.4 30.0 - 36.0 g/dL   RDW 78.214.5 95.611.5 - 21.315.5 %   Platelets 320 150 - 400 K/uL   nRBC 0.0 0.0 - 0.2 %    Comment: Performed at [facility], [address] [place], [place] [address]  Comprehensive metabolic panel     Status: Abnormal   Collection Time: 03/24/19  9:15 PM  Result Value Ref Range   Sodium 136 135 - 145 mmol/L   Potassium 3.8 3.5 - 5.1 mmol/L   Chloride 107 98 - 111  mmol/L   CO2 19 (L) 22 - 32 mmol/L   Glucose, Bld 116 (H) 70 - 99 mg/dL   BUN 20 8 - 23 mg/dL   Creatinine, Ser 1.611.07 (H) 0.44 - 1.00 mg/dL   Calcium 9.0 8.9 - 09.610.3 mg/dL   Total Protein 7.0 6.5 - 8.1 g/dL   Albumin 3.7 3.5 - 5.0 g/dL   AST 23 15 - 41 U/L   ALT 14 0 - 44 U/L   Alkaline Phosphatase 73 38 - 126 U/L   Total Bilirubin 0.8 0.3 - 1.2 mg/dL   GFR calc non Af Amer 43 (L) >60 mL/min   GFR calc Af Amer 50 (L) >60 mL/min   Anion gap 10 5 - 15    Comment: Performed at [facility], [address] [place], [place] [address]   Ct Head Wo Contrast  Result Date: 03/24/2019 CLINICAL DATA:  Recent fall with headaches and neck pain EXAM: CT HEAD WITHOUT CONTRAST CT CERVICAL SPINE WITHOUT CONTRAST TECHNIQUE: Multidetector CT imaging of the head and cervical spine was performed following the standard protocol without intravenous contrast. Multiplanar CT image reconstructions of the cervical spine were also  generated. COMPARISON:  09/21/2016 FINDINGS: CT HEAD FINDINGS Brain: Diffuse atrophic and chronic white matter ischemic changes are noted. No findings to suggest acute hemorrhage, acute infarction or space-occupying mass lesion are noted. Vascular: No hyperdense vessel or unexpected calcification. Skull: Normal. Negative for fracture or focal lesion. Sinuses/Orbits: No acute finding. Other: Mild right forehead scalp hematoma. CT CERVICAL SPINE FINDINGS Alignment: Mild anterolisthesis of C3 on C4 and C4 on C5 is noted of a degenerative nature. Skull base and vertebrae: Facet hypertrophic changes are noted throughout the cervical spine. No acute fracture or acute facet abnormality is noted. The odontoid is within normal limits. Soft tissues and spinal canal: Surrounding soft tissue structures are within normal limits. Upper chest: Visualized lung apices are unremarkable. Other: None IMPRESSION: CT of the head: Chronic atrophic and ischemic changes without acute abnormality. CT of cervical spine: Multilevel degenerative change without acute abnormality. Electronically Signed   By: Alcide CleverMark  Lukens M.D.   On: 03/24/2019 22:15   Ct Cervical Spine Wo Contrast  Result Date: 03/24/2019 CLINICAL DATA:  Recent fall with headaches and neck pain EXAM: CT HEAD WITHOUT CONTRAST CT CERVICAL SPINE WITHOUT CONTRAST TECHNIQUE: Multidetector CT imaging of the head and cervical spine was performed following the standard protocol without intravenous contrast. Multiplanar CT image reconstructions of the cervical spine were also generated. COMPARISON:  09/21/2016 FINDINGS: CT HEAD FINDINGS Brain: Diffuse atrophic and chronic white matter ischemic changes are noted. No findings to suggest acute hemorrhage, acute infarction or space-occupying mass lesion are noted. Vascular: No hyperdense vessel or unexpected calcification. Skull: Normal. Negative for fracture or focal lesion. Sinuses/Orbits: No acute finding. Other: Mild right forehead  scalp hematoma. CT CERVICAL SPINE FINDINGS Alignment: Mild anterolisthesis of C3 on C4 and C4 on C5 is noted of a degenerative nature. Skull base and vertebrae: Facet hypertrophic changes are noted throughout the cervical spine. No acute fracture or acute facet abnormality is noted. The odontoid is within normal limits. Soft tissues and spinal canal: Surrounding soft tissue structures are within normal limits. Upper chest: Visualized lung apices are unremarkable. Other: None IMPRESSION: CT of the head: Chronic atrophic and ischemic changes without acute abnormality. CT of cervical spine: Multilevel degenerative change without acute abnormality. Electronically Signed   By: Alcide CleverMark  Lukens M.D.   On: 03/24/2019 22:15   Ct Pelvis Wo  Contrast  Result Date: 03/24/2019 CLINICAL DATA:  Pain status post fall EXAM: CT PELVIS WITHOUT CONTRAST TECHNIQUE: Multidetector CT imaging of the pelvis was performed following the standard protocol without intravenous contrast. COMPARISON:  None. FINDINGS: Urinary Tract: The bladder is not well evaluated secondary to extensive streak artifact from the patient's right hip prosthesis. Bowel: There is sigmoid diverticulosis without CT evidence for diverticulitis. The visualized small bowel loops are unremarkable. Vascular/Lymphatic: No pathologically enlarged lymph nodes. No significant vascular abnormality seen. Extensive atherosclerotic changes are noted of the partially visualized abdominal aorta. Reproductive: The patient is status post hysterectomy. There is no evidence for an ovarian mass, however evaluation is limited by lack of IV contrast. Other:  None. Musculoskeletal: There is an acute displaced fracture of the right inferior pubic ramus. There is an acute nondisplaced fracture through the posterior acetabulum on the right. The fracture extends anteriorly towards the junction of the posterior and anterior acetabulum on the right. IMPRESSION: 1. Acute displaced slightly comminuted  fracture of the right inferior pubic ramus. 2. Acute nondisplaced fracture through the posterior right acetabulum. 3. No dislocation. 4. Status post total hip arthroplasty on the right. Electronically Signed   By: Constance Holster M.D.   On: 03/24/2019 23:04   Dg Chest Portable 1 View  Result Date: 03/24/2019 CLINICAL DATA:  Pain status post fall EXAM: PORTABLE CHEST 1 VIEW COMPARISON:  Chest x-ray dated October 16, 2018 FINDINGS: Heart size is stable from prior study. There are aortic calcifications. There are multiple old healed left-sided rib fractures. There is a deformity of the left chest wall related to these rib fractures. There is no pneumothorax. No acute osseous abnormality. No large pleural effusion. No focal infiltrate. IMPRESSION: No active disease. Electronically Signed   By: Constance Holster M.D.   On: 03/24/2019 22:00   Dg Hip Unilat  With Pelvis 2-3 Views Right  Result Date: 03/24/2019 CLINICAL DATA:  Pain status post fall EXAM: DG HIP (WITH OR WITHOUT PELVIS) 2-3V RIGHT COMPARISON:  August 09, 2013 FINDINGS: The patient is status post total hip arthroplasty on the right. There is diffuse osteopenia which limits detection of nondisplaced fractures. There is no dislocation. There are very subtle lucency through the ischium, as well as the inferior and superior pubic rami on the right. These lucencies are not visualized on all views and may be artifactual. There is a slightly irregular appearance of the lesser trochanter on the right which is only visualized on one view. IMPRESSION: 1. No definite acute displaced fracture or dislocation, however evaluation is limited by diffuse osteopenia. 2. Subtle lucencies as detailed above involving the right ischium as well as the inferior and superior pubic rami. While these could represent nondisplaced fractures, they are not visualized on all views. If there is high clinical suspicion for right-sided pelvic fractures, follow-up with CT is  recommended. 3. Status post total hip arthroplasty on the right. Electronically Signed   By: Constance Holster M.D.   On: 03/24/2019 21:59    Pending Labs Unresulted Labs (From admission, onward)    Start     Ordered   03/24/19 2259  SARS CORONAVIRUS 2 Nasal Swab Aptima Multi Swab  (Asymptomatic/Tier 2 Patients Labs)  Once,   STAT    Question Answer Comment  Is this test for diagnosis or screening Screening   Symptomatic for COVID-19 as defined by CDC No   Hospitalized for COVID-19 No   Admitted to ICU for COVID-19 No   Previously tested for COVID-19 No  Resident in a congregate (group) care setting No   Employed in healthcare setting No   Pregnant No      03/24/19 2258          Vitals/Pain Today's Vitals   03/24/19 2232 03/24/19 2249 03/24/19 2250 03/24/19 2300  BP: (!) 149/83 (!) 169/97  (!) 166/91  Pulse: 95  95 88  Resp: (!) 25  (!) 23 19  Temp:      TempSrc:      SpO2: 93%  97% 96%  Weight:      Height:      PainSc:        Isolation Precautions No active isolations  Medications Medications  fentaNYL (SUBLIMAZE) injection 25 mcg (25 mcg Intravenous Given 03/24/19 2234)    Mobility walks with device High fall risk   Focused Assessments    R Recommendations: See Admitting Provider Note  Report given to: Corrie DandyMary, RN

## 2019-03-24 NOTE — ED Triage Notes (Signed)
Pt arrived via [facility] from [facility] r/t a fall. Pt was found on the bathroom floor by staff. Pt is not on blood thinners but has a hematoma on the right side of the head and internal rotation of the right hip. Pt was given fentanyl by EMS staff.

## 2019-03-24 NOTE — ED Provider Notes (Signed)
[facility] Emergency Department Provider Note  Time seen: 9:24 PM  I have reviewed the triage vital signs and the nursing notes.   HISTORY  Chief Complaint Fall   HPI ANALEIGH ARIES is a 85 y.o. female with a past medical history of anxiety, CHF, CAD, dementia, hypertension, hyperlipidemia presents to the emergency department after a fall.  According to EMS report patient was found on the ground next to her bed at her nursing facility.  Unwitnessed fall.  Patient has a moderate sized hematoma to the right forehead, was complaining of right hip pain with EMS.  Received fentanyl by EMS.  Upon arrival patient is awake and alert, she is oriented to person and place.  Does not recall how she fell.  Patient is mildly hypoxic in the 80s on room air however patient did receive fentanyl just prior to arrival.  Patient does have a mildly shortened right lower extremity.   Past Medical History:  Diagnosis Date  . Anxiety   . CHF (congestive heart failure) ([place])   . Coronary artery disease   . Dementia ([place])   . Diverticulitis   . Dysphagia   . Epilepsy ([place])   . Glaucoma   . Hyperlipidemia   . Hypertension   . Hypothyroidism   . Muscle weakness     Patient Active Problem List   Diagnosis Date Noted  . Hip fracture ([place]) 08/09/2013    Past Surgical History:  Procedure Laterality Date  . ABDOMINAL HYSTERECTOMY    . Hip Replacement  Right 2009    Prior to Admission medications   Medication Sig Start Date End Date Taking? Authorizing Provider  acetaminophen (TYLENOL) 325 MG tablet Take 650 mg by mouth every 6 (six) hours as needed.    [provider]  amLODipine (NORVASC) 5 MG tablet Take 5 mg by mouth daily.    [provider]  aspirin EC 81 MG tablet Take 81 mg by mouth daily.    [provider]  Cholecalciferol (D3-1000) 1000 units tablet Take 2,000 Units by mouth daily.     [provider]  citalopram (CELEXA) 10 MG  tablet Take 10 mg by mouth daily.    [provider]  furosemide (LASIX) 20 MG tablet Take 20 mg by mouth daily.    [provider]  ipratropium (ATROVENT) 0.03 % nasal spray Place 2 sprays into both nostrils 3 (three) times daily as needed for congestion. 09/01/18   [provider]  latanoprost (XALATAN) 0.005 % ophthalmic solution Place 1 drop into the right eye at bedtime.     [provider]  levothyroxine (SYNTHROID, LEVOTHROID) 100 MCG tablet Take 100 mcg by mouth daily before breakfast.     [provider]  Melatonin 3 MG TABS Take 3 mg by mouth at bedtime.    [provider]  metoprolol succinate (TOPROL-XL) 100 MG 24 hr tablet Take 100 mg by mouth daily. Take with or immediately following a meal.    [provider]  Polyethyl Glycol-Propyl Glycol (LUBRICANT EYE DROPS) 0.4-0.3 % SOLN Place 1 drop into both eyes 2 (two) times daily.    [provider]  polyethylene glycol (MIRALAX / GLYCOLAX) packet Take 17 g by mouth every other day.    [provider]  POTASSIUM CHLORIDE PO Take 10 mEq by mouth daily.    [provider]  senna-docusate (SENOKOT-S) 8.6-50 MG per tablet Take 1 tablet by mouth 2 (two) times daily.  [provider]  traZODone (DESYREL) 50 MG tablet Take 25 mg by mouth at bedtime.    [provider]    Allergies  Allergen Reactions  . Ciprofloxacin   . Macrobid [employer][Nitrofurantoin Monohyd Macro]   . Penicillins   . Percocet [Oxycodone-Acetaminophen]   . Sulfa Antibiotics   . Tussionex Pennkinetic Er [Hydrocod Polst-Cpm Polst Er]   . Vicodin [Hydrocodone-Acetaminophen]     History reviewed. No pertinent family history.  Social History Social History   Tobacco Use  . Smoking status: Never Smoker  . Smokeless tobacco: Never Used  Substance Use Topics  . Alcohol use: No  . Drug use: No    Review of Systems Unable to obtain an adequate/accurate review of  systems secondary to baseline dementia.  ____________________________________________   PHYSICAL EXAM:  VITAL SIGNS: ED Triage Vitals  Enc Vitals Group     BP 03/24/19 2115 (!) 147/82     Pulse Rate 03/24/19 2115 94     Resp 03/24/19 2115 18     Temp 03/24/19 2115 97.8 F (36.6 C)     Temp Source 03/24/19 2115 Oral     SpO2 03/24/19 2115 91 %     Weight 03/24/19 2111 138 lb 14.2 oz (63 kg)     Height 03/24/19 2111 5\' 3"  (1.6 m)     Head Circumference --      Peak Flow --      Pain Score 03/24/19 2111 0     Pain Loc --      Pain Edu? --      Excl. in GC? --    Constitutional: Patient is awake and alert oriented to person and place only.  Unable to provide much history. Eyes: Normal exam ENT      Head: Moderate hematoma to right forehead      Mouth/Throat: Mucous membranes are moist. Cardiovascular: Normal rate, regular rhythm. Respiratory: Normal respiratory effort without tachypnea nor retractions. Breath sounds are clear  Gastrointestinal: Soft and nontender. No distention.   Musculoskeletal: Patient does have a slightly shortened right lower extremity however she has no obvious tenderness to the right hip, good range of motion, 2+ DP pulse.  No obvious discomfort. Neurologic:  Normal speech and language. No gross focal neurologic deficits Skin:  Skin is warm, dry and intact.  Psychiatric: Mood and affect are normal.   ____________________________________________    EKG  EKG viewed and interpreted by myself appears to show a sinus rhythm at 90 bpm with a narrow QRS, normal axis, with what appears to be a prolonged PR interval, with nonspecific ST changes.  ____________________________________________    RADIOLOGY  Pelvis/hip x-ray shows possible pelvis fracture.  Status post total hip. Chest x-ray negative CT head neck are negative.  ____________________________________________   INITIAL IMPRESSION / ASSESSMENT AND PLAN / ED COURSE  Pertinent labs &  imaging results that were available during my care of the patient were reviewed by me and considered in my medical decision making (see chart for details).   Patient presents to the emergency department after a fall at a nursing facility.  Right hip pain per EMS received fentanyl and.  No appreciable hip pain or discomfort in the emergency department.  Patient did fall, has a hematoma to her right forehead.  We will check labs, x-ray of the hip and pelvis as a precaution we will obtain CT imaging of the head and neck as well as a precaution.  Overall the patient appears well, no acute  distress.  However the patient did just receive fentanyl prior to arrival.  Labs are largely within normal limits.  CT scans of the head and neck are negative.  X-rays however shows possible pelvis fracture patient is complaining of right pelvis pain once again we will dose 25 mcg of fentanyl for the patient.  We will obtain a CT scan of the patient's pelvis if the patient has hip fractures given she is [age] we will admit to the hospital service for continued pain management and likely placement.  Patient did become hypoxic upon arrival which I believe is likely due to fentanyl prior to arrival with EMS.  CT pending, patient care signed out to Dr. Manson PasseyBrown.  Calton GoldsRuth C Nuno was evaluated in Emergency Department on 03/24/2019 for the symptoms described in the history of present illness. She was evaluated in the context of the global COVID-19 pandemic, which necessitated consideration that the patient might be at risk for infection with the SARS-CoV-2 virus that causes COVID-19. Institutional protocols and algorithms that pertain to the evaluation of patients at risk for COVID-19 are in a state of rapid change based on information released by regulatory bodies including the CDC and federal and state organizations. These policies and algorithms were followed during the patient's care in the  ED.  ____________________________________________   FINAL CLINICAL IMPRESSION(S) / ED DIAGNOSES  Thresa RossFall   Tayten Heber, MD 03/24/19 2259

## 2019-03-24 NOTE — H&P (Signed)
[facility] [place] [facility]   PATIENT NAME: Gloria GuestRuth Martinez    MR#:  [id number]  DATE OF BIRTH:  10/20/1919  DATE OF ADMISSION:  03/24/2019  PRIMARY CARE PHYSICIAN: Lauro RegulusAnderson, Marshall W, MD   REQUESTING/REFERRING PHYSICIAN: Lenard LancePaduchowski, MD  CHIEF COMPLAINT:   Chief Complaint  Patient presents with  . Fall    HISTORY OF PRESENT ILLNESS:  Gloria Martinez  is a 85 y.o. female who presents with chief complaint as above.  Patient had a fall at home and presents the ED afterwards with pelvic girdle pain.  She is found to have pelvic fractures and a posterior right acetabular fracture.  Hospitalist were called for admission  PAST MEDICAL HISTORY:   Past Medical History:  Diagnosis Date  . Anxiety   . CHF (congestive heart failure) (HCC)   . Coronary artery disease   . Dementia (HCC)   . Diverticulitis   . Dysphagia   . Epilepsy (HCC)   . Glaucoma   . Hyperlipidemia   . Hypertension   . Hypothyroidism   . Muscle weakness      PAST SURGICAL HISTORY:   Past Surgical History:  Procedure Laterality Date  . ABDOMINAL HYSTERECTOMY    . Hip Replacement  Right 2009     SOCIAL HISTORY:   Social History   Tobacco Use  . Smoking status: Never Smoker  . Smokeless tobacco: Never Used  Substance Use Topics  . Alcohol use: No     FAMILY HISTORY:    Family history reviewed and is non-contributory DRUG ALLERGIES:   Allergies  Allergen Reactions  . Ciprofloxacin   . Macrobid [employer][Nitrofurantoin Monohyd Macro]   . Penicillins   . Percocet [Oxycodone-Acetaminophen]   . Sulfa Antibiotics   . Tussionex Pennkinetic Er [Hydrocod Polst-Cpm Polst Er]   . Vicodin [Hydrocodone-Acetaminophen]     MEDICATIONS AT HOME:   Prior to Admission medications   Medication Sig Start Date End Date Taking? Authorizing Provider  acetaminophen (TYLENOL) 325 MG tablet Take 650 mg by mouth every 6 (six) hours as needed for mild pain or fever.    Yes [provider]  alum & mag hydroxide-simeth (MAALOX/MYLANTA) 200-200-20 MG/5ML suspension Take 30 mLs by mouth every 6 (six) hours as needed for indigestion or heartburn.   Yes [provider]  amLODipine (NORVASC) 5 MG tablet Take 5 mg by mouth daily.   Yes [provider]  aspirin 81 MG chewable tablet Chew 81 mg by mouth daily.   Yes [provider]  barrier cream (NON-SPECIFIED) CREA Apply 1 application topically as needed (prevention of skin breakdown).   Yes [provider]  brimonidine (ALPHAGAN) 0.2 % ophthalmic solution Place 1 drop into both eyes 2 (two) times daily.   Yes [provider]  Cholecalciferol (D3-1000) 1000 units tablet Take 2,000 Units by mouth daily.    Yes [provider]  citalopram (CELEXA) 10 MG tablet Take 10 mg by mouth daily.   Yes [provider]  diphenhydrAMINE (BENADRYL) 25 MG tablet Take 25 mg by mouth every 6 (six) hours as needed for allergies.   Yes [provider]  furosemide (LASIX) 20 MG tablet Take 20 mg by mouth daily.   Yes [provider]  guaiFENesin (ROBITUSSIN) 100 MG/5ML liquid Take 200 mg by mouth 4 (four) times daily as needed for cough.   Yes [provider]  hydrocortisone cream 1 % Apply 1 application topically daily as needed for itching.  Yes [provider]  ipratropium (ATROVENT) 0.03 % nasal spray Place 2 sprays into both nostrils 3 (three) times daily as needed for congestion. 09/01/18  Yes [provider]  latanoprost (XALATAN) 0.005 % ophthalmic solution Place 1 drop into the right eye at bedtime.    Yes [provider]  levothyroxine (SYNTHROID, LEVOTHROID) 100 MCG tablet Take 100 mcg by mouth daily before breakfast.    Yes [provider]  loperamide (IMODIUM A-D) 2 MG tablet Take 4 mg by mouth 4 (four) times daily as needed for diarrhea or loose stools.   Yes [provider]  magnesium hydroxide (MILK  OF MAGNESIA) 400 MG/5ML suspension Take 30 mLs by mouth daily as needed for mild constipation.   Yes [provider]  Melatonin 3 MG TABS Take 3 mg by mouth at bedtime.   Yes [provider]  Menthol-Zinc Oxide (GOLD BOND EX) Apply 1 application topically daily as needed (itching). May use powder or lotion   Yes [provider]  metoprolol succinate (TOPROL-XL) 100 MG 24 hr tablet Take 100 mg by mouth daily. Take with or immediately following a meal.   Yes [provider]  neomycin-bacitracin-polymyxin (NEOSPORIN) ointment Apply 1 application topically as needed (skin abrasions/ minor skin tears).   Yes [provider]  Polyethyl Glycol-Propyl Glycol (LUBRICANT EYE DROPS) 0.4-0.3 % SOLN Place 1 drop into both eyes 2 (two) times daily.   Yes [provider]  polyethylene glycol (MIRALAX / GLYCOLAX) packet Take 17 g by mouth every other day.   Yes [provider]  potassium chloride (K-DUR) 10 MEQ tablet Take 10 mEq by mouth daily.   Yes [provider]  pyrithione zinc (HEAD AND SHOULDERS) 1 % shampoo Apply 1 application topically daily as needed (scalp irritation/ dandruff).   Yes [provider]  senna-docusate (SENOKOT-S) 8.6-50 MG tablet Take 1 tablet by mouth 2 (two) times daily.   Yes [provider]  traZODone (DESYREL) 50 MG tablet Take 50 mg by mouth at bedtime.    Yes [provider]    REVIEW OF SYSTEMS:  Review of Systems  Constitutional: Negative for chills, fever, malaise/fatigue and weight loss.  HENT: Negative for ear pain, hearing loss and tinnitus.   Eyes: Negative for blurred vision, double vision, pain and redness.  Respiratory: Negative for cough, hemoptysis and shortness of breath.   Cardiovascular: Negative for chest pain, palpitations, orthopnea and leg swelling.  Gastrointestinal: Negative for abdominal pain, constipation, diarrhea, nausea and vomiting.  Genitourinary:  Negative for dysuria, frequency and hematuria.  Musculoskeletal: Positive for falls. Negative for back pain, joint pain and neck pain.       Pelvic girdle pain  Skin:       No acne, rash, or lesions  Neurological: Negative for dizziness, tremors, focal weakness and weakness.  Endo/Heme/Allergies: Negative for polydipsia. Does not bruise/bleed easily.  Psychiatric/Behavioral: Negative for depression. The patient is not nervous/anxious and does not have insomnia.      VITAL SIGNS:   Vitals:   03/24/19 2249 03/24/19 2250 03/24/19 2300 03/24/19 2330  BP: (!) 169/97  (!) 166/91 (!) 166/89  Pulse:  95 88 84  Resp:  (!) 23 19 17   Temp:      TempSrc:      SpO2:  97% 96% 95%  Weight:      Height:       Wt Readings from Last 3 Encounters:  03/24/19 63 kg  10/16/18 63 kg  10/14/18  63.5 kg    PHYSICAL EXAMINATION:  Physical Exam  Vitals reviewed. Constitutional: She appears well-developed and well-nourished. No distress.  HENT:  Head: Normocephalic and atraumatic.  Mouth/Throat: Oropharynx is clear and moist.  Eyes: Pupils are equal, round, and reactive to light. Conjunctivae and EOM are normal. No scleral icterus.  Neck: Normal range of motion. Neck supple. No JVD present. No thyromegaly present.  Cardiovascular: Normal rate, regular rhythm and intact distal pulses. Exam reveals no gallop and no friction rub.  No murmur heard. Respiratory: Effort normal and breath sounds normal. No respiratory distress. She has no wheezes. She has no rales.  GI: Soft. Bowel sounds are normal. She exhibits no distension. There is no abdominal tenderness.  Musculoskeletal: Normal range of motion.        General: Tenderness (Pelvic bones) present. No edema.     Comments: No arthritis, no gout  Lymphadenopathy:    She has no cervical adenopathy.  Neurological: She is alert. No cranial nerve deficit.  No dysarthria, no aphasia  Skin: Skin is warm and dry. No rash noted. No erythema.  Psychiatric:  She has a normal mood and affect. Her behavior is normal. Judgment and thought content normal.    LABORATORY PANEL:   CBC Recent Labs  Lab 03/24/19 2115  WBC 9.9  HGB 14.1  HCT 42.2  PLT 320   ------------------------------------------------------------------------------------------------------------------  Chemistries  Recent Labs  Lab 03/24/19 2115  NA 136  K 3.8  CL 107  CO2 19*  GLUCOSE 116*  BUN 20  CREATININE 1.07*  CALCIUM 9.0  AST 23  ALT 14  ALKPHOS 73  BILITOT 0.8   ------------------------------------------------------------------------------------------------------------------  Cardiac Enzymes No results for input(s): TROPONINI in the last 168 hours. ------------------------------------------------------------------------------------------------------------------  RADIOLOGY:  Ct Head Wo Contrast  Result Date: 03/24/2019 CLINICAL DATA:  Recent fall with headaches and neck pain EXAM: CT HEAD WITHOUT CONTRAST CT CERVICAL SPINE WITHOUT CONTRAST TECHNIQUE: Multidetector CT imaging of the head and cervical spine was performed following the standard protocol without intravenous contrast. Multiplanar CT image reconstructions of the cervical spine were also generated. COMPARISON:  09/21/2016 FINDINGS: CT HEAD FINDINGS Brain: Diffuse atrophic and chronic white matter ischemic changes are noted. No findings to suggest acute hemorrhage, acute infarction or space-occupying mass lesion are noted. Vascular: No hyperdense vessel or unexpected calcification. Skull: Normal. Negative for fracture or focal lesion. Sinuses/Orbits: No acute finding. Other: Mild right forehead scalp hematoma. CT CERVICAL SPINE FINDINGS Alignment: Mild anterolisthesis of C3 on C4 and C4 on C5 is noted of a degenerative nature. Skull base and vertebrae: Facet hypertrophic changes are noted throughout the cervical spine. No acute fracture or acute facet abnormality is noted. The odontoid is within normal  limits. Soft tissues and spinal canal: Surrounding soft tissue structures are within normal limits. Upper chest: Visualized lung apices are unremarkable. Other: None IMPRESSION: CT of the head: Chronic atrophic and ischemic changes without acute abnormality. CT of cervical spine: Multilevel degenerative change without acute abnormality. Electronically Signed   By: Alcide CleverMark  Lukens M.D.   On: 03/24/2019 22:15   Ct Cervical Spine Wo Contrast  Result Date: 03/24/2019 CLINICAL DATA:  Recent fall with headaches and neck pain EXAM: CT HEAD WITHOUT CONTRAST CT CERVICAL SPINE WITHOUT CONTRAST TECHNIQUE: Multidetector CT imaging of the head and cervical spine was performed following the standard protocol without intravenous contrast. Multiplanar CT image reconstructions of the cervical spine were also generated. COMPARISON:  09/21/2016 FINDINGS: CT HEAD FINDINGS Brain: Diffuse atrophic and chronic white matter  ischemic changes are noted. No findings to suggest acute hemorrhage, acute infarction or space-occupying mass lesion are noted. Vascular: No hyperdense vessel or unexpected calcification. Skull: Normal. Negative for fracture or focal lesion. Sinuses/Orbits: No acute finding. Other: Mild right forehead scalp hematoma. CT CERVICAL SPINE FINDINGS Alignment: Mild anterolisthesis of C3 on C4 and C4 on C5 is noted of a degenerative nature. Skull base and vertebrae: Facet hypertrophic changes are noted throughout the cervical spine. No acute fracture or acute facet abnormality is noted. The odontoid is within normal limits. Soft tissues and spinal canal: Surrounding soft tissue structures are within normal limits. Upper chest: Visualized lung apices are unremarkable. Other: None IMPRESSION: CT of the head: Chronic atrophic and ischemic changes without acute abnormality. CT of cervical spine: Multilevel degenerative change without acute abnormality. Electronically Signed   By: Alcide CleverMark  Lukens M.D.   On: 03/24/2019 22:15   Ct  Pelvis Wo Contrast  Result Date: 03/24/2019 CLINICAL DATA:  Pain status post fall EXAM: CT PELVIS WITHOUT CONTRAST TECHNIQUE: Multidetector CT imaging of the pelvis was performed following the standard protocol without intravenous contrast. COMPARISON:  None. FINDINGS: Urinary Tract: The bladder is not well evaluated secondary to extensive streak artifact from the patient's right hip prosthesis. Bowel: There is sigmoid diverticulosis without CT evidence for diverticulitis. The visualized small bowel loops are unremarkable. Vascular/Lymphatic: No pathologically enlarged lymph nodes. No significant vascular abnormality seen. Extensive atherosclerotic changes are noted of the partially visualized abdominal aorta. Reproductive: The patient is status post hysterectomy. There is no evidence for an ovarian mass, however evaluation is limited by lack of IV contrast. Other:  None. Musculoskeletal: There is an acute displaced fracture of the right inferior pubic ramus. There is an acute nondisplaced fracture through the posterior acetabulum on the right. The fracture extends anteriorly towards the junction of the posterior and anterior acetabulum on the right. IMPRESSION: 1. Acute displaced slightly comminuted fracture of the right inferior pubic ramus. 2. Acute nondisplaced fracture through the posterior right acetabulum. 3. No dislocation. 4. Status post total hip arthroplasty on the right. Electronically Signed   By: Katherine Mantlehristopher  Green M.D.   On: 03/24/2019 23:04   Dg Chest Portable 1 View  Result Date: 03/24/2019 CLINICAL DATA:  Pain status post fall EXAM: PORTABLE CHEST 1 VIEW COMPARISON:  Chest x-ray dated October 16, 2018 FINDINGS: Heart size is stable from prior study. There are aortic calcifications. There are multiple old healed left-sided rib fractures. There is a deformity of the left chest wall related to these rib fractures. There is no pneumothorax. No acute osseous abnormality. No large pleural effusion. No  focal infiltrate. IMPRESSION: No active disease. Electronically Signed   By: Katherine Mantlehristopher  Green M.D.   On: 03/24/2019 22:00   Dg Hip Unilat  With Pelvis 2-3 Views Right  Result Date: 03/24/2019 CLINICAL DATA:  Pain status post fall EXAM: DG HIP (WITH OR WITHOUT PELVIS) 2-3V RIGHT COMPARISON:  August 09, 2013 FINDINGS: The patient is status post total hip arthroplasty on the right. There is diffuse osteopenia which limits detection of nondisplaced fractures. There is no dislocation. There are very subtle lucency through the ischium, as well as the inferior and superior pubic rami on the right. These lucencies are not visualized on all views and may be artifactual. There is a slightly irregular appearance of the lesser trochanter on the right which is only visualized on one view. IMPRESSION: 1. No definite acute displaced fracture or dislocation, however evaluation is limited by diffuse osteopenia. 2. Subtle  lucencies as detailed above involving the right ischium as well as the inferior and superior pubic rami. While these could represent nondisplaced fractures, they are not visualized on all views. If there is high clinical suspicion for right-sided pelvic fractures, follow-up with CT is recommended. 3. Status post total hip arthroplasty on the right. Electronically Signed   By: Katherine Mantle M.D.   On: 03/24/2019 21:59    EKG:   Orders placed or performed during the hospital encounter of 03/24/19  . ED EKG  . ED EKG  . EKG 12-Lead  . EKG 12-Lead    IMPRESSION AND PLAN:  Principal Problem:   Pelvic fracture (HCC) -as well as a right acetabular fracture.  We will get an orthopedic consult.  PRN symptom control.  Patient may likely also need PT and OT Active Problems:   HTN (hypertension) -home dose antihypertensives   CAD (coronary artery disease) -continue home meds   Hypothyroidism -home dose thyroid replacement   HLD (hyperlipidemia) -home dose antilipid  Chart review performed and  case discussed with ED provider. Labs, imaging and/or ECG reviewed by provider and discussed with patient/family. Management plans discussed with the patient and/or family.  COVID-19 status: Pending  DVT PROPHYLAXIS: SubQ lovenox   GI PROPHYLAXIS:  None  ADMISSION STATUS: Inpatient     CODE STATUS: DNR Code Status History    Date Active Date Inactive Code Status Order ID Comments User Context   08/09/2013 0509 08/13/2013 1641 Full Code [id number]  Hillary Bow, DO ED   Advance Care Planning Activity      TOTAL TIME TAKING CARE OF THIS PATIENT: 45 minutes.   This patient was evaluated in the context of the global COVID-19 pandemic, which necessitated consideration that the patient might be at risk for infection with the SARS-CoV-2 virus that causes COVID-19. Institutional protocols and algorithms that pertain to the evaluation of patients at risk for COVID-19 are in a state of rapid change based on information released by regulatory bodies including the CDC and federal and state organizations. These policies and algorithms were followed to the best of this provider's knowledge to date during the patient's care at this facility.  Barney Drain 03/24/2019, 11:56 PM  [facility]  Office  [phone]  CC: Primary care physician; Lauro Regulus, MD  Note:  This document was prepared using Dragon voice recognition software and may include unintentional dictation errors.

## 2019-03-25 ENCOUNTER — Other Ambulatory Visit: Payer: Self-pay

## 2019-03-25 LAB — CBC
HCT: 40.5 % (ref 36.0–46.0)
Hemoglobin: 13.7 g/dL (ref 12.0–15.0)
MCH: 31.2 pg (ref 26.0–34.0)
MCHC: 33.8 g/dL (ref 30.0–36.0)
MCV: 92.3 fL (ref 80.0–100.0)
Platelets: 289 10*3/uL (ref 150–400)
RBC: 4.39 MIL/uL (ref 3.87–5.11)
RDW: 14.3 % (ref 11.5–15.5)
WBC: 9.8 10*3/uL (ref 4.0–10.5)
nRBC: 0 % (ref 0.0–0.2)

## 2019-03-25 LAB — BASIC METABOLIC PANEL
Anion gap: 9 (ref 5–15)
BUN: 17 mg/dL (ref 8–23)
CO2: 20 mmol/L — ABNORMAL LOW (ref 22–32)
Calcium: 9 mg/dL (ref 8.9–10.3)
Chloride: 108 mmol/L (ref 98–111)
Creatinine, Ser: 0.96 mg/dL (ref 0.44–1.00)
GFR calc Af Amer: 57 mL/min — ABNORMAL LOW (ref >60)
GFR calc non Af Amer: 50 mL/min — ABNORMAL LOW (ref >60)
Glucose, Bld: 110 mg/dL — ABNORMAL HIGH (ref 70–99)
Potassium: 4 mmol/L (ref 3.5–5.1)
Sodium: 137 mmol/L (ref 135–145)

## 2019-03-25 LAB — SURGICAL PCR SCREEN
MRSA, PCR: NEGATIVE
Staphylococcus aureus: NEGATIVE

## 2019-03-25 LAB — SARS CORONAVIRUS 2 (TAT 6-24 HRS): SARS Coronavirus 2: NEGATIVE

## 2019-03-25 MED ORDER — LOPERAMIDE HCL 2 MG PO CAPS
4.0000 mg | ORAL_CAPSULE | Freq: Four times a day (QID) | ORAL | Status: DC | PRN
  Filled 2019-03-25: qty 2

## 2019-03-25 MED ORDER — VITAMIN D3 25 MCG (1000 UNIT) PO TABS
2000.0000 [IU] | ORAL_TABLET | Freq: Every day | ORAL | Status: DC
  Administered 2019-03-26 – 2019-03-27 (×2): 2000 [IU] via ORAL
  Filled 2019-03-25 (×4): qty 2

## 2019-03-25 MED ORDER — LATANOPROST 0.005 % OP SOLN
1.0000 [drp] | Freq: Every day | OPHTHALMIC | Status: DC
  Administered 2019-03-25 – 2019-03-26 (×2): 1 [drp] via OPHTHALMIC
  Filled 2019-03-25: qty 2.5

## 2019-03-25 MED ORDER — METOPROLOL SUCCINATE ER 50 MG PO TB24
100.0000 mg | ORAL_TABLET | Freq: Every day | ORAL | Status: DC
  Administered 2019-03-25 – 2019-03-27 (×2): 100 mg via ORAL
  Filled 2019-03-25 (×3): qty 2

## 2019-03-25 MED ORDER — BRIMONIDINE TARTRATE 0.2 % OP SOLN
1.0000 [drp] | Freq: Two times a day (BID) | OPHTHALMIC | Status: DC
  Administered 2019-03-25 – 2019-03-27 (×4): 1 [drp] via OPHTHALMIC
  Filled 2019-03-25: qty 5

## 2019-03-25 MED ORDER — ACETAMINOPHEN 325 MG PO TABS
650.0000 mg | ORAL_TABLET | Freq: Four times a day (QID) | ORAL | Status: DC | PRN
  Administered 2019-03-27: 650 mg via ORAL
  Filled 2019-03-25: qty 2

## 2019-03-25 MED ORDER — ONDANSETRON HCL 4 MG/2ML IJ SOLN: 4.0000 mg | Freq: Four times a day (QID) | INTRAMUSCULAR | Status: DC | PRN

## 2019-03-25 MED ORDER — ALUM & MAG HYDROXIDE-SIMETH 200-200-20 MG/5ML PO SUSP: 30.0000 mL | Freq: Four times a day (QID) | ORAL | Status: DC | PRN

## 2019-03-25 MED ORDER — SENNOSIDES-DOCUSATE SODIUM 8.6-50 MG PO TABS
1.0000 | ORAL_TABLET | Freq: Two times a day (BID) | ORAL | Status: DC
  Administered 2019-03-25 – 2019-03-27 (×5): 1 via ORAL
  Filled 2019-03-25 (×5): qty 1

## 2019-03-25 MED ORDER — AMLODIPINE BESYLATE 5 MG PO TABS
5.0000 mg | ORAL_TABLET | Freq: Every day | ORAL | Status: DC
  Administered 2019-03-25 – 2019-03-27 (×3): 5 mg via ORAL
  Filled 2019-03-25 (×3): qty 1

## 2019-03-25 MED ORDER — ACETAMINOPHEN 650 MG RE SUPP: 650.0000 mg | Freq: Four times a day (QID) | RECTAL | Status: DC | PRN

## 2019-03-25 MED ORDER — ASPIRIN 81 MG PO CHEW
81.0000 mg | CHEWABLE_TABLET | Freq: Every day | ORAL | Status: DC
  Administered 2019-03-25 – 2019-03-27 (×3): 81 mg via ORAL
  Filled 2019-03-25 (×3): qty 1

## 2019-03-25 MED ORDER — TRAZODONE HCL 50 MG PO TABS
50.0000 mg | ORAL_TABLET | Freq: Every day | ORAL | Status: DC
  Administered 2019-03-25 – 2019-03-26 (×2): 50 mg via ORAL
  Filled 2019-03-25 (×2): qty 1

## 2019-03-25 MED ORDER — LEVOTHYROXINE SODIUM 100 MCG PO TABS
100.0000 ug | ORAL_TABLET | Freq: Every day | ORAL | Status: DC
  Administered 2019-03-26 – 2019-03-27 (×2): 100 ug via ORAL
  Filled 2019-03-25 (×2): qty 1

## 2019-03-25 MED ORDER — MAGNESIUM HYDROXIDE 400 MG/5ML PO SUSP: 30.0000 mL | Freq: Every day | ORAL | Status: DC | PRN

## 2019-03-25 MED ORDER — POTASSIUM CHLORIDE CRYS ER 10 MEQ PO TBCR
10.0000 meq | EXTENDED_RELEASE_TABLET | Freq: Every day | ORAL | Status: DC
  Administered 2019-03-25 – 2019-03-27 (×3): 10 meq via ORAL
  Filled 2019-03-25 (×3): qty 1

## 2019-03-25 MED ORDER — ONDANSETRON HCL 4 MG PO TABS: 4.0000 mg | ORAL_TABLET | Freq: Four times a day (QID) | ORAL | Status: DC | PRN

## 2019-03-25 MED ORDER — MORPHINE SULFATE (PF) 2 MG/ML IV SOLN
2.0000 mg | INTRAVENOUS | Status: DC | PRN
  Administered 2019-03-25 – 2019-03-26 (×3): 2 mg via INTRAVENOUS
  Filled 2019-03-25 (×3): qty 1

## 2019-03-25 MED ORDER — MELATONIN 5 MG PO TABS
2.5000 mg | ORAL_TABLET | Freq: Every day | ORAL | Status: DC
  Administered 2019-03-25 – 2019-03-26 (×2): 2.5 mg via ORAL
  Filled 2019-03-25 (×3): qty 0.5

## 2019-03-25 MED ORDER — DIPHENHYDRAMINE HCL 25 MG PO CAPS: 25.0000 mg | ORAL_CAPSULE | Freq: Four times a day (QID) | ORAL | Status: DC | PRN

## 2019-03-25 MED ORDER — ENOXAPARIN SODIUM 30 MG/0.3ML ~~LOC~~ SOLN
30.0000 mg | SUBCUTANEOUS | Status: DC
  Administered 2019-03-25 – 2019-03-26 (×2): 30 mg via SUBCUTANEOUS
  Filled 2019-03-25 (×2): qty 0.3

## 2019-03-25 MED ORDER — CITALOPRAM HYDROBROMIDE 20 MG PO TABS
10.0000 mg | ORAL_TABLET | Freq: Every day | ORAL | Status: DC
  Administered 2019-03-25 – 2019-03-27 (×3): 10 mg via ORAL
  Filled 2019-03-25 (×3): qty 1

## 2019-03-25 NOTE — Progress Notes (Signed)
[employer] - [place] [facility]     PATIENT NAME: Gloria GuestRuth Kierstead    MR#:  [id number]  DATE OF BIRTH:  10/28/1919  SUBJECTIVE:   Patient presented to the hospital with a fall noted to have a right-sided pelvic fracture with right posterior acetabular fracture.  Patient is currently demented and confused.  No other acute events overnight.  REVIEW OF SYSTEMS:    Review of Systems  Unable to perform ROS: Mental acuity    Nutrition: Heart Healthy Tolerating Diet: Yes Tolerating PT: Await Eval.    DRUG ALLERGIES:   Allergies  Allergen Reactions   Ciprofloxacin    Macrobid [Nitrofurantoin Monohyd Macro]    Penicillins    Percocet [Oxycodone-Acetaminophen]    Sulfa Antibiotics    Tussionex Pennkinetic Er [Hydrocod Polst-Cpm Polst Er]    Vicodin [Hydrocodone-Acetaminophen]     VITALS:  Blood pressure 138/85, pulse 89, temperature 97.6 F (36.4 C), temperature source Oral, resp. rate 16, height 5\' 3"  (1.6 m), weight 49.9 kg, SpO2 95 %.  PHYSICAL EXAMINATION:   Physical Exam  GENERAL:  85 y.o.-year-old demented patient lying in bed in no acute distress.  EYES: Pupils equal, round, reactive to light and accommodation. No scleral icterus. Extraocular muscles intact.  HEENT: Head atraumatic, normocephalic. Oropharynx and nasopharynx clear.  NECK:  Supple, no jugular venous distention. No thyroid enlargement, no tenderness.  LUNGS: Normal breath sounds bilaterally, no wheezing, rales, rhonchi. No use of accessory muscles of respiration.  CARDIOVASCULAR: S1, S2 normal. No murmurs, rubs, or gallops.  ABDOMEN: Soft, nontender, nondistended. Bowel sounds present. No organomegaly or mass.  EXTREMITIES: No cyanosis, clubbing or edema b/l.    NEUROLOGIC: Cranial nerves II through XII are intact. No focal Motor or sensory deficits b/l. Globally weak.   PSYCHIATRIC: The patient is alert and oriented x 1.  SKIN: No obvious rash, lesion, or ulcer.    LABORATORY  PANEL:   CBC Recent Labs  Lab 03/25/19 0725  WBC 9.8  HGB 13.7  HCT 40.5  PLT 289   ------------------------------------------------------------------------------------------------------------------  Chemistries  Recent Labs  Lab 03/24/19 2115 03/25/19 0725  NA 136 137  K 3.8 4.0  CL 107 108  CO2 19* 20*  GLUCOSE 116* 110*  BUN 20 17  CREATININE 1.07* 0.96  CALCIUM 9.0 9.0  AST 23  --   ALT 14  --   ALKPHOS 73  --   BILITOT 0.8  --    ------------------------------------------------------------------------------------------------------------------  Cardiac Enzymes No results for input(s): TROPONINI in the last 168 hours. ------------------------------------------------------------------------------------------------------------------  RADIOLOGY:  Ct Head Wo Contrast  Result Date: 03/24/2019 CLINICAL DATA:  Recent fall with headaches and neck pain EXAM: CT HEAD WITHOUT CONTRAST CT CERVICAL SPINE WITHOUT CONTRAST TECHNIQUE: Multidetector CT imaging of the head and cervical spine was performed following the standard protocol without intravenous contrast. Multiplanar CT image reconstructions of the cervical spine were also generated. COMPARISON:  09/21/2016 FINDINGS: CT HEAD FINDINGS Brain: Diffuse atrophic and chronic white matter ischemic changes are noted. No findings to suggest acute hemorrhage, acute infarction or space-occupying mass lesion are noted. Vascular: No hyperdense vessel or unexpected calcification. Skull: Normal. Negative for fracture or focal lesion. Sinuses/Orbits: No acute finding. Other: Mild right forehead scalp hematoma. CT CERVICAL SPINE FINDINGS Alignment: Mild anterolisthesis of C3 on C4 and C4 on C5 is noted of a degenerative nature. Skull base and vertebrae: Facet hypertrophic changes are noted throughout the cervical spine. No acute fracture or acute facet abnormality is noted. The odontoid  is within normal limits. Soft tissues and spinal canal:  Surrounding soft tissue structures are within normal limits. Upper chest: Visualized lung apices are unremarkable. Other: None IMPRESSION: CT of the head: Chronic atrophic and ischemic changes without acute abnormality. CT of cervical spine: Multilevel degenerative change without acute abnormality. Electronically Signed   By: Alcide CleverMark  Lukens M.D.   On: 03/24/2019 22:15   Ct Cervical Spine Wo Contrast  Result Date: 03/24/2019 CLINICAL DATA:  Recent fall with headaches and neck pain EXAM: CT HEAD WITHOUT CONTRAST CT CERVICAL SPINE WITHOUT CONTRAST TECHNIQUE: Multidetector CT imaging of the head and cervical spine was performed following the standard protocol without intravenous contrast. Multiplanar CT image reconstructions of the cervical spine were also generated. COMPARISON:  09/21/2016 FINDINGS: CT HEAD FINDINGS Brain: Diffuse atrophic and chronic white matter ischemic changes are noted. No findings to suggest acute hemorrhage, acute infarction or space-occupying mass lesion are noted. Vascular: No hyperdense vessel or unexpected calcification. Skull: Normal. Negative for fracture or focal lesion. Sinuses/Orbits: No acute finding. Other: Mild right forehead scalp hematoma. CT CERVICAL SPINE FINDINGS Alignment: Mild anterolisthesis of C3 on C4 and C4 on C5 is noted of a degenerative nature. Skull base and vertebrae: Facet hypertrophic changes are noted throughout the cervical spine. No acute fracture or acute facet abnormality is noted. The odontoid is within normal limits. Soft tissues and spinal canal: Surrounding soft tissue structures are within normal limits. Upper chest: Visualized lung apices are unremarkable. Other: None IMPRESSION: CT of the head: Chronic atrophic and ischemic changes without acute abnormality. CT of cervical spine: Multilevel degenerative change without acute abnormality. Electronically Signed   By: Alcide CleverMark  Lukens M.D.   On: 03/24/2019 22:15   Ct Pelvis Wo Contrast  Result Date:  03/24/2019 CLINICAL DATA:  Pain status post fall EXAM: CT PELVIS WITHOUT CONTRAST TECHNIQUE: Multidetector CT imaging of the pelvis was performed following the standard protocol without intravenous contrast. COMPARISON:  None. FINDINGS: Urinary Tract: The bladder is not well evaluated secondary to extensive streak artifact from the patient's right hip prosthesis. Bowel: There is sigmoid diverticulosis without CT evidence for diverticulitis. The visualized small bowel loops are unremarkable. Vascular/Lymphatic: No pathologically enlarged lymph nodes. No significant vascular abnormality seen. Extensive atherosclerotic changes are noted of the partially visualized abdominal aorta. Reproductive: The patient is status post hysterectomy. There is no evidence for an ovarian mass, however evaluation is limited by lack of IV contrast. Other:  None. Musculoskeletal: There is an acute displaced fracture of the right inferior pubic ramus. There is an acute nondisplaced fracture through the posterior acetabulum on the right. The fracture extends anteriorly towards the junction of the posterior and anterior acetabulum on the right. IMPRESSION: 1. Acute displaced slightly comminuted fracture of the right inferior pubic ramus. 2. Acute nondisplaced fracture through the posterior right acetabulum. 3. No dislocation. 4. Status post total hip arthroplasty on the right. Electronically Signed   By: Katherine Mantlehristopher  Green M.D.   On: 03/24/2019 23:04   Dg Chest Portable 1 View  Result Date: 03/24/2019 CLINICAL DATA:  Pain status post fall EXAM: PORTABLE CHEST 1 VIEW COMPARISON:  Chest x-ray dated October 16, 2018 FINDINGS: Heart size is stable from prior study. There are aortic calcifications. There are multiple old healed left-sided rib fractures. There is a deformity of the left chest wall related to these rib fractures. There is no pneumothorax. No acute osseous abnormality. No large pleural effusion. No focal infiltrate. IMPRESSION: No  active disease. Electronically Signed   By: Cristal Deerhristopher  Green M.D.   On: 03/24/2019 22:00   Dg Hip Unilat  With Pelvis 2-3 Views Right  Result Date: 03/24/2019 CLINICAL DATA:  Pain status post fall EXAM: DG HIP (WITH OR WITHOUT PELVIS) 2-3V RIGHT COMPARISON:  August 09, 2013 FINDINGS: The patient is status post total hip arthroplasty on the right. There is diffuse osteopenia which limits detection of nondisplaced fractures. There is no dislocation. There are very subtle lucency through the ischium, as well as the inferior and superior pubic rami on the right. These lucencies are not visualized on all views and may be artifactual. There is a slightly irregular appearance of the lesser trochanter on the right which is only visualized on one view. IMPRESSION: 1. No definite acute displaced fracture or dislocation, however evaluation is limited by diffuse osteopenia. 2. Subtle lucencies as detailed above involving the right ischium as well as the inferior and superior pubic rami. While these could represent nondisplaced fractures, they are not visualized on all views. If there is high clinical suspicion for right-sided pelvic fractures, follow-up with CT is recommended. 3. Status post total hip arthroplasty on the right. Electronically Signed   By: Constance Holster M.D.   On: 03/24/2019 21:59     ASSESSMENT AND PLAN:   85 year old female with past medical history of dementia, coronary artery disease, glaucoma, hypertension, hyperlipidemia, hypothyroidism, history of CHF, anxiety who presented to the hospital after a fall and noted to have a pelvic and right acetabular fracture.  1.  Status post fall with pelvic and acetabular fracture on the right- seen by orthopedics and this is likely nonoperable. - Continue supportive care with pain control and physical therapy as tolerated.  2.  Essential hypertension-continue Toprol.  3.  Hypothyroidism-continue Synthroid.  4.  History of glaucoma-continue  Alphagan, Xalantan.   5. Depression - cont. Celexa.   Await PT eval.    All the records are reviewed and case discussed with Care Management/Social Worker. Management plans discussed with the patient, family and they are in agreement.  CODE STATUS: DNR  DVT Prophylaxis: Lovenox  TOTAL TIME TAKING CARE OF THIS PATIENT: 30 minutes.   POSSIBLE D/C IN 1-2 DAYS, DEPENDING ON CLINICAL CONDITION.   Henreitta Leber M.D on 03/25/2019 at 2:04 PM  Between 7am to 6pm - Pager - [phone]  After 6pm go to [email or web address] - [employer] [facility]  Office  [phone]  CC: Primary care physician; Kirk Ruths, MD

## 2019-03-25 NOTE — Progress Notes (Signed)
PHARMACIST - PHYSICIAN COMMUNICATION  CONCERNING:  Enoxaparin (Lovenox) for DVT Prophylaxis    RECOMMENDATION: Patient was prescribed enoxaprin 40mg  q24 hours for VTE prophylaxis.   Filed Weights   03/24/19 2111  Weight: 138 lb 14.2 oz (63 kg)    Body mass index is 24.6 kg/m.  Estimated Creatinine Clearance: 26.9 mL/min (A) (by C-G formula based on SCr of 1.07 mg/dL (H)).  Patient is candidate for enoxaparin 30mg  every 24 hours based on CrCl <57ml/min   DESCRIPTION: Pharmacy has adjusted enoxaparin dose per [facility]  policy.  Patient is now receiving enoxaparin 30mg  every 24 hours.  Pernell Dupre, PharmD, BCPS Clinical Pharmacist 03/25/2019 1:15 AM

## 2019-03-25 NOTE — Consult Note (Signed)
ORTHOPAEDIC CONSULTATION  REQUESTING PHYSICIAN: Houston Siren, MD  Chief Complaint: right pelvic fractures s/p fall at the [facility].  HPI: Gloria Martinez is a 85 y.o. female who is admitted to the hospitalist service overnight after a reported unwitnessed fall at the [facility] where she lives.  Patient was diagnosed with right-sided pelvic fractures by CT scan.  Orthopedics is consulted for management of these fractures.  Patient is sitting in bed.  She is in no acute distress.  She does not recall falling and seems confused.   I have spoken with Mr. Blima Ledger, the patient's son-in-law and power of attorney.  He was unaware the patient had been admitted to the hospital but I updated him on the patient's status.  He informs me the patient does not ambulate at baseline.  She essentially only transfers from bed to chair with assistance.  Past Medical History:  Diagnosis Date  . Anxiety   . CHF (congestive heart failure) (HCC)   . Coronary artery disease   . Dementia (HCC)   . Diverticulitis   . Dysphagia   . Epilepsy (HCC)   . Glaucoma   . Hyperlipidemia   . Hypertension   . Hypothyroidism   . Muscle weakness    Past Surgical History:  Procedure Laterality Date  . ABDOMINAL HYSTERECTOMY    . Hip Replacement  Right 2009   Social History   Socioeconomic History  . Marital status: Widowed    Spouse name: Not on file  . Number of children: Not on file  . Years of education: Not on file  . Highest education level: Not on file  Occupational History  . Not on file  Social Needs  . Financial resource strain: Not on file  . Food insecurity    Worry: Not on file    Inability: Not on file  . Transportation needs    Medical: Not on file    Non-medical: Not on file  Tobacco Use  . Smoking status: Never Smoker  . Smokeless tobacco: Never Used  Substance and Sexual Activity  . Alcohol use: No  . Drug use: No  . Sexual activity: Not on file  Lifestyle  . Physical activity    Days  per week: Not on file    Minutes per session: Not on file  . Stress: Not on file  Relationships  . Social Musician on phone: Not on file    Gets together: Not on file    Attends religious service: Not on file    Active member of club or organization: Not on file    Attends meetings of clubs or organizations: Not on file    Relationship status: Not on file  Other Topics Concern  . Not on file  Social History Narrative  . Not on file   History reviewed. No pertinent family history. Allergies  Allergen Reactions  . Ciprofloxacin   . Macrobid [employer] Macro]   . Penicillins   . Percocet [Oxycodone-Acetaminophen]   . Sulfa Antibiotics   . Tussionex Pennkinetic Er [Hydrocod Polst-Cpm Polst Er]   . Vicodin [Hydrocodone-Acetaminophen]    Prior to Admission medications   Medication Sig Start Date End Date Taking? Authorizing Provider  acetaminophen (TYLENOL) 325 MG tablet Take 650 mg by mouth every 6 (six) hours as needed for mild pain or fever.    Yes [provider]  alum & mag hydroxide-simeth (MAALOX/MYLANTA) 200-200-20 MG/5ML suspension Take 30 mLs by mouth every 6 (six) hours  as needed for indigestion or heartburn.   Yes [provider]  amLODipine (NORVASC) 5 MG tablet Take 5 mg by mouth daily.   Yes [provider]  aspirin 81 MG chewable tablet Chew 81 mg by mouth daily.   Yes [provider]  barrier cream (NON-SPECIFIED) CREA Apply 1 application topically as needed (prevention of skin breakdown).   Yes [provider]  brimonidine (ALPHAGAN) 0.2 % ophthalmic solution Place 1 drop into both eyes 2 (two) times daily.   Yes [provider]  Cholecalciferol (D3-1000) 1000 units tablet Take 2,000 Units by mouth daily.    Yes [provider]  citalopram (CELEXA) 10 MG tablet Take 10 mg by mouth daily.   Yes [provider]  diphenhydrAMINE (BENADRYL) 25 MG tablet Take 25 mg by mouth  every 6 (six) hours as needed for allergies.   Yes [provider]  furosemide (LASIX) 20 MG tablet Take 20 mg by mouth daily.   Yes [provider]  guaiFENesin (ROBITUSSIN) 100 MG/5ML liquid Take 200 mg by mouth 4 (four) times daily as needed for cough.   Yes [provider]  hydrocortisone cream 1 % Apply 1 application topically daily as needed for itching.   Yes [provider]  ipratropium (ATROVENT) 0.03 % nasal spray Place 2 sprays into both nostrils 3 (three) times daily as needed for congestion. 09/01/18  Yes [provider]  latanoprost (XALATAN) 0.005 % ophthalmic solution Place 1 drop into the right eye at bedtime.    Yes [provider]  levothyroxine (SYNTHROID, LEVOTHROID) 100 MCG tablet Take 100 mcg by mouth daily before breakfast.    Yes [provider]  loperamide (IMODIUM A-D) 2 MG tablet Take 4 mg by mouth 4 (four) times daily as needed for diarrhea or loose stools.   Yes [provider]  magnesium hydroxide (MILK OF MAGNESIA) 400 MG/5ML suspension Take 30 mLs by mouth daily as needed for mild constipation.   Yes [provider]  Melatonin 3 MG TABS Take 3 mg by mouth at bedtime.   Yes [provider]  Menthol-Zinc Oxide (GOLD BOND EX) Apply 1 application topically daily as needed (itching). May use powder or lotion   Yes [provider]  metoprolol succinate (TOPROL-XL) 100 MG 24 hr tablet Take 100 mg by mouth daily. Take with or immediately following a meal.   Yes [provider]  neomycin-bacitracin-polymyxin (NEOSPORIN) ointment Apply 1 application topically as needed (skin abrasions/ minor skin tears).   Yes [provider]  Polyethyl Glycol-Propyl Glycol (LUBRICANT EYE DROPS) 0.4-0.3 % SOLN Place 1 drop into both eyes 2 (two) times daily.   Yes [provider]  polyethylene glycol (MIRALAX / GLYCOLAX) packet Take 17 g by mouth every other day.   Yes  [provider]  potassium chloride (K-DUR) 10 MEQ tablet Take 10 mEq by mouth daily.   Yes [provider]  pyrithione zinc (HEAD AND SHOULDERS) 1 % shampoo Apply 1 application topically daily as needed (scalp irritation/ dandruff).   Yes [provider]  senna-docusate (SENOKOT-S) 8.6-50 MG tablet Take 1 tablet by mouth 2 (two) times daily.   Yes [provider]  traZODone (DESYREL) 50 MG tablet Take 50 mg by mouth at bedtime.    Yes [provider]   Ct Head Wo Contrast  Result Date: 03/24/2019 CLINICAL DATA:  Recent fall with headaches and neck pain EXAM: CT HEAD WITHOUT CONTRAST CT CERVICAL SPINE WITHOUT CONTRAST  TECHNIQUE: Multidetector CT imaging of the head and cervical spine was performed following the standard protocol without intravenous contrast. Multiplanar CT image reconstructions of the cervical spine were also generated. COMPARISON:  09/21/2016 FINDINGS: CT HEAD FINDINGS Brain: Diffuse atrophic and chronic white matter ischemic changes are noted. No findings to suggest acute hemorrhage, acute infarction or space-occupying mass lesion are noted. Vascular: No hyperdense vessel or unexpected calcification. Skull: Normal. Negative for fracture or focal lesion. Sinuses/Orbits: No acute finding. Other: Mild right forehead scalp hematoma. CT CERVICAL SPINE FINDINGS Alignment: Mild anterolisthesis of C3 on C4 and C4 on C5 is noted of a degenerative nature. Skull base and vertebrae: Facet hypertrophic changes are noted throughout the cervical spine. No acute fracture or acute facet abnormality is noted. The odontoid is within normal limits. Soft tissues and spinal canal: Surrounding soft tissue structures are within normal limits. Upper chest: Visualized lung apices are unremarkable. Other: None IMPRESSION: CT of the head: Chronic atrophic and ischemic changes without acute abnormality. CT of cervical spine: Multilevel degenerative change without acute  abnormality. Electronically Signed   By: Alcide CleverMark  Lukens M.D.   On: 03/24/2019 22:15   Ct Cervical Spine Wo Contrast  Result Date: 03/24/2019 CLINICAL DATA:  Recent fall with headaches and neck pain EXAM: CT HEAD WITHOUT CONTRAST CT CERVICAL SPINE WITHOUT CONTRAST TECHNIQUE: Multidetector CT imaging of the head and cervical spine was performed following the standard protocol without intravenous contrast. Multiplanar CT image reconstructions of the cervical spine were also generated. COMPARISON:  09/21/2016 FINDINGS: CT HEAD FINDINGS Brain: Diffuse atrophic and chronic white matter ischemic changes are noted. No findings to suggest acute hemorrhage, acute infarction or space-occupying mass lesion are noted. Vascular: No hyperdense vessel or unexpected calcification. Skull: Normal. Negative for fracture or focal lesion. Sinuses/Orbits: No acute finding. Other: Mild right forehead scalp hematoma. CT CERVICAL SPINE FINDINGS Alignment: Mild anterolisthesis of C3 on C4 and C4 on C5 is noted of a degenerative nature. Skull base and vertebrae: Facet hypertrophic changes are noted throughout the cervical spine. No acute fracture or acute facet abnormality is noted. The odontoid is within normal limits. Soft tissues and spinal canal: Surrounding soft tissue structures are within normal limits. Upper chest: Visualized lung apices are unremarkable. Other: None IMPRESSION: CT of the head: Chronic atrophic and ischemic changes without acute abnormality. CT of cervical spine: Multilevel degenerative change without acute abnormality. Electronically Signed   By: Alcide CleverMark  Lukens M.D.   On: 03/24/2019 22:15   Ct Pelvis Wo Contrast  Result Date: 03/24/2019 CLINICAL DATA:  Pain status post fall EXAM: CT PELVIS WITHOUT CONTRAST TECHNIQUE: Multidetector CT imaging of the pelvis was performed following the standard protocol without intravenous contrast. COMPARISON:  None. FINDINGS: Urinary Tract: The bladder is not well evaluated secondary  to extensive streak artifact from the patient's right hip prosthesis. Bowel: There is sigmoid diverticulosis without CT evidence for diverticulitis. The visualized small bowel loops are unremarkable. Vascular/Lymphatic: No pathologically enlarged lymph nodes. No significant vascular abnormality seen. Extensive atherosclerotic changes are noted of the partially visualized abdominal aorta. Reproductive: The patient is status post hysterectomy. There is no evidence for an ovarian mass, however evaluation is limited by lack of IV contrast. Other:  None. Musculoskeletal: There is an acute displaced fracture of the right inferior pubic ramus. There is an acute nondisplaced fracture through the posterior acetabulum on the right. The fracture extends anteriorly towards the junction of the posterior and anterior acetabulum on the right. IMPRESSION: 1. Acute displaced slightly comminuted fracture of  the right inferior pubic ramus. 2. Acute nondisplaced fracture through the posterior right acetabulum. 3. No dislocation. 4. Status post total hip arthroplasty on the right. Electronically Signed   By: Katherine Mantlehristopher  Green M.D.   On: 03/24/2019 23:04   Dg Chest Portable 1 View  Result Date: 03/24/2019 CLINICAL DATA:  Pain status post fall EXAM: PORTABLE CHEST 1 VIEW COMPARISON:  Chest x-ray dated October 16, 2018 FINDINGS: Heart size is stable from prior study. There are aortic calcifications. There are multiple old healed left-sided rib fractures. There is a deformity of the left chest wall related to these rib fractures. There is no pneumothorax. No acute osseous abnormality. No large pleural effusion. No focal infiltrate. IMPRESSION: No active disease. Electronically Signed   By: Katherine Mantlehristopher  Green M.D.   On: 03/24/2019 22:00   Dg Hip Unilat  With Pelvis 2-3 Views Right  Result Date: 03/24/2019 CLINICAL DATA:  Pain status post fall EXAM: DG HIP (WITH OR WITHOUT PELVIS) 2-3V RIGHT COMPARISON:  August 09, 2013 FINDINGS: The  patient is status post total hip arthroplasty on the right. There is diffuse osteopenia which limits detection of nondisplaced fractures. There is no dislocation. There are very subtle lucency through the ischium, as well as the inferior and superior pubic rami on the right. These lucencies are not visualized on all views and may be artifactual. There is a slightly irregular appearance of the lesser trochanter on the right which is only visualized on one view. IMPRESSION: 1. No definite acute displaced fracture or dislocation, however evaluation is limited by diffuse osteopenia. 2. Subtle lucencies as detailed above involving the right ischium as well as the inferior and superior pubic rami. While these could represent nondisplaced fractures, they are not visualized on all views. If there is high clinical suspicion for right-sided pelvic fractures, follow-up with CT is recommended. 3. Status post total hip arthroplasty on the right. Electronically Signed   By: Katherine Mantlehristopher  Green M.D.   On: 03/24/2019 21:59    Positive ROS: All other systems have been reviewed and were otherwise negative with the exception of those mentioned in the HPI and as above.  Physical Exam: General: Alert, no acute distress, patient has ecchymosis overlying the right side of her forehead.  Skin is intact.  MUSCULOSKELETAL: Right lower extremity: Patient was in bed with her hips and knees flexed approximately 90 degrees.  Her right leg is crossed over the left leg.  Her legs were uncrossed for examination purposes and she seems to have increased tone in her lower extremities with slight flexion contracture at both the hip and knee.  Patient's skin is intact.  There is no erythema ecchymosis or deformity seen.  Patient does not have any significant swelling in her leg compartments are soft and compressible.  There is no obvious ecchymosis/hematoma seen overlying the hip or pelvis.  Distally the patient has intact sensation to light  touch, palpable pedal pulses and intact motor function distally.  Assessment: Right nondisplaced posterior acetabular fracture and comminuted inferior rami fracture  Plan: I reviewed the patient's radiographic studies.  The patient has fractures as noted above.  These fractures will not require surgical intervention.  There is no evidence of loosening of her right total hip arthroplasty components.  Patient does not ambulate at baseline.  She may be bed to chair and should avoid weightbearing on the right lower extremity.  She will require assistance for transfers.  Patient may be discharged when cleared medically.  She may follow-up in our  office in approximately 2 to 4 weeks upon discharge from the hospital.   Juanell FairlyKevin Emmagene Ortner, MD    03/25/2019 1:46 PM

## 2019-03-26 MED ORDER — ENSURE ENLIVE PO LIQD: 237.0000 mL | Freq: Two times a day (BID) | ORAL | Status: DC

## 2019-03-26 NOTE — Progress Notes (Signed)
[employer] [facility] [place] [facility]     PATIENT NAME: Jorene GuestRuth Pacer    MR#:  [id number]  DATE OF BIRTH:  05/17/1920  SUBJECTIVE:   Patient presented to the hospital with a fall noted to have a right-sided pelvic fracture with right posterior acetabular fracture.  Patient is currently demented and confused.  No other acute events overnight.  REVIEW OF SYSTEMS:    Review of Systems  Unable to perform ROS: Mental acuity    Nutrition: Heart Healthy Tolerating Diet: Yes Tolerating PT: Await Eval.    DRUG ALLERGIES:   Allergies  Allergen Reactions   Ciprofloxacin    Macrobid [Nitrofurantoin Monohyd Macro]    Penicillins    Percocet [Oxycodone-Acetaminophen]    Sulfa Antibiotics    Tussionex Pennkinetic Er [Hydrocod Polst-Cpm Polst Er]    Vicodin [Hydrocodone-Acetaminophen]     VITALS:  Blood pressure (!) 149/78, pulse (!) 59, temperature 98.2 F (36.8 C), temperature source Oral, resp. rate 16, height 5\' 3"  (1.6 m), weight 49.9 kg, SpO2 94 %.  PHYSICAL EXAMINATION:   Physical Exam  GENERAL:  85 y.o.-year-old demented patient lying in bed in a contracted position.  EYES: No scleral icterus. Extraocular muscles intact.  HEENT: Head atraumatic, normocephalic. Oropharynx and nasopharynx clear. Bruising on the right eye/head area.  NECK:  Supple, no jugular venous distention. No thyroid enlargement, no tenderness.  LUNGS: Normal breath sounds bilaterally, no wheezing, rales, rhonchi. No use of accessory muscles of respiration.  CARDIOVASCULAR: S1, S2 normal. No murmurs, rubs, or gallops.  ABDOMEN: Soft, nontender, nondistended. Bowel sounds present. No organomegaly or mass.  EXTREMITIES: No cyanosis, clubbing or edema b/l.    NEUROLOGIC: Cranial nerves II through XII are intact. No focal Motor or sensory deficits b/l. Globally weak.   PSYCHIATRIC: The patient is alert and oriented x 1.  SKIN: No obvious rash, lesion, or ulcer.    LABORATORY PANEL:    CBC Recent Labs  Lab 03/25/19 0725  WBC 9.8  HGB 13.7  HCT 40.5  PLT 289   ------------------------------------------------------------------------------------------------------------------  Chemistries  Recent Labs  Lab 03/24/19 2115 03/25/19 0725  NA 136 137  K 3.8 4.0  CL 107 108  CO2 19* 20*  GLUCOSE 116* 110*  BUN 20 17  CREATININE 1.07* 0.96  CALCIUM 9.0 9.0  AST 23  --   ALT 14  --   ALKPHOS 73  --   BILITOT 0.8  --    ------------------------------------------------------------------------------------------------------------------  Cardiac Enzymes No results for input(s): TROPONINI in the last 168 hours. ------------------------------------------------------------------------------------------------------------------  RADIOLOGY:  Ct Head Wo Contrast  Result Date: 03/24/2019 CLINICAL DATA:  Recent fall with headaches and neck pain EXAM: CT HEAD WITHOUT CONTRAST CT CERVICAL SPINE WITHOUT CONTRAST TECHNIQUE: Multidetector CT imaging of the head and cervical spine was performed following the standard protocol without intravenous contrast. Multiplanar CT image reconstructions of the cervical spine were also generated. COMPARISON:  09/21/2016 FINDINGS: CT HEAD FINDINGS Brain: Diffuse atrophic and chronic white matter ischemic changes are noted. No findings to suggest acute hemorrhage, acute infarction or space-occupying mass lesion are noted. Vascular: No hyperdense vessel or unexpected calcification. Skull: Normal. Negative for fracture or focal lesion. Sinuses/Orbits: No acute finding. Other: Mild right forehead scalp hematoma. CT CERVICAL SPINE FINDINGS Alignment: Mild anterolisthesis of C3 on C4 and C4 on C5 is noted of a degenerative nature. Skull base and vertebrae: Facet hypertrophic changes are noted throughout the cervical spine. No acute fracture or acute facet abnormality is noted. The odontoid  is within normal limits. Soft tissues and spinal canal: Surrounding  soft tissue structures are within normal limits. Upper chest: Visualized lung apices are unremarkable. Other: None IMPRESSION: CT of the head: Chronic atrophic and ischemic changes without acute abnormality. CT of cervical spine: Multilevel degenerative change without acute abnormality. Electronically Signed   By: Inez Catalina M.D.   On: 03/24/2019 22:15   Ct Cervical Spine Wo Contrast  Result Date: 03/24/2019 CLINICAL DATA:  Recent fall with headaches and neck pain EXAM: CT HEAD WITHOUT CONTRAST CT CERVICAL SPINE WITHOUT CONTRAST TECHNIQUE: Multidetector CT imaging of the head and cervical spine was performed following the standard protocol without intravenous contrast. Multiplanar CT image reconstructions of the cervical spine were also generated. COMPARISON:  09/21/2016 FINDINGS: CT HEAD FINDINGS Brain: Diffuse atrophic and chronic white matter ischemic changes are noted. No findings to suggest acute hemorrhage, acute infarction or space-occupying mass lesion are noted. Vascular: No hyperdense vessel or unexpected calcification. Skull: Normal. Negative for fracture or focal lesion. Sinuses/Orbits: No acute finding. Other: Mild right forehead scalp hematoma. CT CERVICAL SPINE FINDINGS Alignment: Mild anterolisthesis of C3 on C4 and C4 on C5 is noted of a degenerative nature. Skull base and vertebrae: Facet hypertrophic changes are noted throughout the cervical spine. No acute fracture or acute facet abnormality is noted. The odontoid is within normal limits. Soft tissues and spinal canal: Surrounding soft tissue structures are within normal limits. Upper chest: Visualized lung apices are unremarkable. Other: None IMPRESSION: CT of the head: Chronic atrophic and ischemic changes without acute abnormality. CT of cervical spine: Multilevel degenerative change without acute abnormality. Electronically Signed   By: Inez Catalina M.D.   On: 03/24/2019 22:15   Ct Pelvis Wo Contrast  Result Date: 03/24/2019 CLINICAL  DATA:  Pain status post fall EXAM: CT PELVIS WITHOUT CONTRAST TECHNIQUE: Multidetector CT imaging of the pelvis was performed following the standard protocol without intravenous contrast. COMPARISON:  None. FINDINGS: Urinary Tract: The bladder is not well evaluated secondary to extensive streak artifact from the patient's right hip prosthesis. Bowel: There is sigmoid diverticulosis without CT evidence for diverticulitis. The visualized small bowel loops are unremarkable. Vascular/Lymphatic: No pathologically enlarged lymph nodes. No significant vascular abnormality seen. Extensive atherosclerotic changes are noted of the partially visualized abdominal aorta. Reproductive: The patient is status post hysterectomy. There is no evidence for an ovarian mass, however evaluation is limited by lack of IV contrast. Other:  None. Musculoskeletal: There is an acute displaced fracture of the right inferior pubic ramus. There is an acute nondisplaced fracture through the posterior acetabulum on the right. The fracture extends anteriorly towards the junction of the posterior and anterior acetabulum on the right. IMPRESSION: 1. Acute displaced slightly comminuted fracture of the right inferior pubic ramus. 2. Acute nondisplaced fracture through the posterior right acetabulum. 3. No dislocation. 4. Status post total hip arthroplasty on the right. Electronically Signed   By: Constance Holster M.D.   On: 03/24/2019 23:04   Dg Chest Portable 1 View  Result Date: 03/24/2019 CLINICAL DATA:  Pain status post fall EXAM: PORTABLE CHEST 1 VIEW COMPARISON:  Chest x-ray dated October 16, 2018 FINDINGS: Heart size is stable from prior study. There are aortic calcifications. There are multiple old healed left-sided rib fractures. There is a deformity of the left chest wall related to these rib fractures. There is no pneumothorax. No acute osseous abnormality. No large pleural effusion. No focal infiltrate. IMPRESSION: No active disease.  Electronically Signed   By: Harrell Gave  Green M.D.   On: 03/24/2019 22:00   Dg Hip Unilat  With Pelvis 2-3 Views Right  Result Date: 03/24/2019 CLINICAL DATA:  Pain status post fall EXAM: DG HIP (WITH OR WITHOUT PELVIS) 2-3V RIGHT COMPARISON:  August 09, 2013 FINDINGS: The patient is status post total hip arthroplasty on the right. There is diffuse osteopenia which limits detection of nondisplaced fractures. There is no dislocation. There are very subtle lucency through the ischium, as well as the inferior and superior pubic rami on the right. These lucencies are not visualized on all views and may be artifactual. There is a slightly irregular appearance of the lesser trochanter on the right which is only visualized on one view. IMPRESSION: 1. No definite acute displaced fracture or dislocation, however evaluation is limited by diffuse osteopenia. 2. Subtle lucencies as detailed above involving the right ischium as well as the inferior and superior pubic rami. While these could represent nondisplaced fractures, they are not visualized on all views. If there is high clinical suspicion for right-sided pelvic fractures, follow-up with CT is recommended. 3. Status post total hip arthroplasty on the right. Electronically Signed   By: Katherine Mantlehristopher  Green M.D.   On: 03/24/2019 21:59     ASSESSMENT AND PLAN:   85 year old female with past medical history of dementia, coronary artery disease, glaucoma, hypertension, hyperlipidemia, hypothyroidism, history of CHF, anxiety who presented to the hospital after a fall and noted to have a pelvic and right acetabular fracture.  1.  Status post fall with pelvic and acetabular fracture on the right- seen by orthopedics and no plans for surgical intervention.  - Continue supportive care with pain control and physical therapy as tolerated.  2.  Essential hypertension-continue Toprol.  3.  Hypothyroidism-continue Synthroid.  4.  History of glaucoma-continue Alphagan,  Xalantan.   5. Depression - cont. Celexa.   Await PT eval patient will likely need a higher level of care as she is from an assisted living.  Social work made aware.  All the records are reviewed and case discussed with Care Management/Social Worker. Management plans discussed with the patient, family and they are in agreement.  CODE STATUS: DNR  DVT Prophylaxis: Lovenox  TOTAL TIME TAKING CARE OF THIS PATIENT: 25 minutes.   POSSIBLE D/C IN 1-2 DAYS, DEPENDING ON CLINICAL CONDITION.   Houston SirenVivek J Kenly Xiao M.D on 03/26/2019 at 2:30 PM  Between 7am to 6pm - Pager - [phone]  After 6pm go to [email or web address] - Scientist, research (life sciences)password [email or web address]  [facility] [place] [facility]  Office  [phone]  CC: Primary care physician; Lauro RegulusAnderson, Marshall W, MD

## 2019-03-26 NOTE — NC FL2 (Signed)
[place] MEDICAID FL2 LEVEL OF CARE SCREENING TOOL     IDENTIFICATION  Patient Name: Gloria Martinez Birthdate: 11/20/1919 Sex: female Admission Date (Current Location): 03/24/2019  [place]   Facility and Address:  [facility], [address], [place], [place] [address]      Provider Number: [id number]  Attending Physician Name and Address:  Houston SirenSainani, Vivek J, MD  Relative Name and Phone Number:  Jori MollDonald HCPOA [phone]    Current Level of Care: Hospital Recommended Level of Care: Skilled Nursing Facility Prior Approval Number:    Date Approved/Denied:   PASRR Number: [id number]  Discharge Plan: SNF    Current Diagnoses: Patient Active Problem List   Diagnosis Date Noted  . Pelvic fracture (HCC) 03/24/2019  . Hypothyroidism 03/24/2019  . HTN (hypertension) 03/24/2019  . HLD (hyperlipidemia) 03/24/2019  . CAD (coronary artery disease) 03/24/2019  . Hip fracture (HCC) 08/09/2013    Orientation RESPIRATION BLADDER Height & Weight     Self  Normal Incontinent Weight: 49.9 kg Height:  5\' 3"  (160 cm)  BEHAVIORAL SYMPTOMS/MOOD NEUROLOGICAL BOWEL NUTRITION STATUS      Incontinent Diet  AMBULATORY STATUS COMMUNICATION OF NEEDS Skin   Extensive Assist Verbally Normal, Bruising                       Personal Care Assistance Level of Assistance  Dressing, Bathing Bathing Assistance: Maximum assistance   Dressing Assistance: Limited assistance     Functional Limitations Info  Sight, Hearing, Speech Sight Info: Adequate Hearing Info: Adequate      SPECIAL CARE FACTORS FREQUENCY  PT (By licensed PT)     PT Frequency: 5 times per week              Contractures Contractures Info: Not present    Additional Factors Info  Code Status, Allergies Code Status Info: DNR Allergies Info: Ciprofloxacin, Macrobid Nitrofurantoin Monohyd Macro, Penicillins, Percocet Oxycodone acetaminophen, Sulfa  Antibiotics, Tussionex Pennkinetic Er Hydrocod Polst cpm Polst Er, Vicodin Hydrocodone-acetaminophen           Current Medications (03/26/2019):  This is the current hospital active medication list Current Facility-Administered Medications  Medication Dose Route Frequency Provider Last Rate Last Dose  . acetaminophen (TYLENOL) tablet 650 mg  650 mg Oral Q6H PRN Oralia ManisWillis, David, MD       Or  . acetaminophen (TYLENOL) suppository 650 mg  650 mg Rectal Q6H PRN Oralia ManisWillis, David, MD      . alum & mag hydroxide-simeth (MAALOX/MYLANTA) 200-200-20 MG/5ML suspension 30 mL  30 mL Oral Q6H PRN Houston SirenSainani, Vivek J, MD      . amLODipine (NORVASC) tablet 5 mg  5 mg Oral Daily Houston SirenSainani, Vivek J, MD   5 mg at 03/25/19 1439  . aspirin chewable tablet 81 mg  81 mg Oral Daily Houston SirenSainani, Vivek J, MD   81 mg at 03/25/19 1439  . brimonidine (ALPHAGAN) 0.2 % ophthalmic solution 1 drop  1 drop Both Eyes BID Houston SirenSainani, Vivek J, MD   1 drop at 03/25/19 2253  . cholecalciferol (VITAMIN D) tablet 2,000 Units  2,000 Units Oral Daily Sainani, Rolly PancakeVivek J, MD      . citalopram (CELEXA) tablet 10 mg  10 mg Oral Daily Houston SirenSainani, Vivek J, MD   10 mg at 03/25/19 1439  . diphenhydrAMINE (BENADRYL) capsule 25 mg  25 mg Oral Q6H PRN Houston SirenSainani, Vivek J, MD      . enoxaparin (LOVENOX) injection  30 mg  30 mg Subcutaneous Q24H Lance Coon, MD   30 mg at 03/25/19 2253  . latanoprost (XALATAN) 0.005 % ophthalmic solution 1 drop  1 drop Right Eye QHS Henreitta Leber, MD   1 drop at 03/25/19 2253  . levothyroxine (SYNTHROID) tablet 100 mcg  100 mcg Oral QAC breakfast Henreitta Leber, MD   100 mcg at 03/26/19 0500  . loperamide (IMODIUM) capsule 4 mg  4 mg Oral QID PRN Henreitta Leber, MD      . magnesium hydroxide (MILK OF MAGNESIA) suspension 30 mL  30 mL Oral Daily PRN Henreitta Leber, MD      . Melatonin TABS 2.5 mg  2.5 mg Oral QHS Henreitta Leber, MD   2.5 mg at 03/25/19 2252  . metoprolol succinate (TOPROL-XL) 24 hr tablet 100 mg  100 mg Oral  Daily Henreitta Leber, MD   100 mg at 03/25/19 1439  . morphine 2 MG/ML injection 2 mg  2 mg Intravenous Q4H PRN Lance Coon, MD   2 mg at 03/26/19 0500  . ondansetron (ZOFRAN) tablet 4 mg  4 mg Oral Q6H PRN Lance Coon, MD       Or  . ondansetron [facility]) injection 4 mg  4 mg Intravenous Q6H PRN Lance Coon, MD      . potassium chloride (K-DUR) CR tablet 10 mEq  10 mEq Oral Daily Henreitta Leber, MD   10 mEq at 03/25/19 1439  . senna-docusate (Senokot-S) tablet 1 tablet  1 tablet Oral BID Henreitta Leber, MD   1 tablet at 03/25/19 2252  . traZODone (DESYREL) tablet 50 mg  50 mg Oral QHS Henreitta Leber, MD   50 mg at 03/25/19 2252     Discharge Medications: Please see discharge summary for a list of discharge medications.  Relevant Imaging Results:  Relevant Lab Results:   Additional Information [id number]  Su Hilt, RN

## 2019-03-26 NOTE — TOC Progression Note (Signed)
Transition of Care [facility]) - Progression Note    Patient Details  Name: Gloria Martinez MRN: [id number] Date of Birth: 11/19/1919  Transition of Care [facility]) CM/SW Contact  Su Hilt, RN Phone Number: 03/26/2019, 3:27 PM  Clinical Narrative:      Spoke with Henriette, we reviewed the bed offers in detail, he chose for the patient to go to [facility] He stated that he did speak to the [facility] and they will hold the bed for when the patient gets out of rehab. I contacted Tammy with [facility] and accepted the bed, will notify Elenore Rota once the DC in in place      Expected Discharge Plan and Services           Expected Discharge Date: 03/28/19                                     Social Determinants of Health (SDOH) Interventions    Readmission Risk Interventions No flowsheet data found.

## 2019-03-26 NOTE — Progress Notes (Signed)
No indication of discomfort at  This time.

## 2019-03-26 NOTE — Progress Notes (Signed)
Received patient from off going RN at 1530. Patient currently resting in bed, supine position with eyes closed. Does not give any appearance of distress/or discomfort. Will continue to observe.

## 2019-03-26 NOTE — TOC Initial Note (Signed)
Transition of Care [facility]) - Initial/Assessment Note    Patient Details  Name: Gloria Martinez MRN: [id number] Date of Birth: [birth year]  Transition of Care [facility]) CM/SW Contact:    Su Hilt, RN Phone Number: 03/26/2019, 8:55 AM  Clinical Narrative:                 Spoke with the brother in law Elenore Rota who is the [facility], He stated that she fell at the [facility] and that he has not heard form the [facility] at all, he is going to reach out to the [facility] and see if they are going to retain her room.  He agrees to do a be search for rehab, we will review the bed offers once obtained        Patient Goals and CMS Choice        Expected Discharge Plan and Services           Expected Discharge Date: 03/28/19                                    Prior Living Arrangements/Services                       Activities of Daily Living Home Assistive Devices/Equipment: None ADL Screening (condition at time of admission) Patient's cognitive ability adequate to safely complete daily activities?: Yes Is the patient deaf or have difficulty hearing?: No Does the patient have difficulty seeing, even when wearing glasses/contacts?: No Does the patient have difficulty concentrating, remembering, or making decisions?: No Patient able to express need for assistance with ADLs?: Yes Does the patient have difficulty dressing or bathing?: No Independently performs ADLs?: Yes (appropriate for developmental age) Does the patient have difficulty walking or climbing stairs?: No Weakness of Legs: None Weakness of Arms/Hands: None  Permission Sought/Granted                  Emotional Assessment              Admission diagnosis:  Pain in pelvis [R10.2] Fall, initial encounter [W19.XXXA] Closed nondisplaced fracture of pelvis, unspecified part of pelvis, initial encounter ([place]) [S32.9XXA] Patient Active Problem List   Diagnosis Date Noted  . Pelvic fracture ([place]) 03/24/2019  .  Hypothyroidism 03/24/2019  . HTN (hypertension) 03/24/2019  . HLD (hyperlipidemia) 03/24/2019  . CAD (coronary artery disease) 03/24/2019  . Hip fracture ([place]) 08/09/2013   PCP:  Kirk Ruths, MD Pharmacy:  No Pharmacies Listed    Social Determinants of Health (SDOH) Interventions    Readmission Risk Interventions No flowsheet data found.

## 2019-03-26 NOTE — Evaluation (Signed)
Physical Therapy Evaluation Patient Details Name: Gloria Martinez MRN: [id number] DOB: Jun 23, 1920 Today's Date: 03/26/2019   History of Present Illness  85 y.o. female with a past medical history of anxiety, CHF, CAD, dementia, hypertension, hyperlipidemia, hypothysoidism, R THA, anxiety presents to the emergency department after a fall 03/24/2019. Dx:a right-sided pelvic fracture with right posterior acetabular fracture and is likely nonoperable.   Clinical Impression  Prior to hospital admission, pt was wheelchair based and non-ambulatory.  Pt lives in assistive living facility.  Currently pt is +2 mod with bed mobility and requires heavy tactile and verbal cueing for hand and body positioning. Pt only orient to her name and not able to answer date/location/situation. Pt requires BUE support for sitting balance and requires constant cueing for R UE hand positioning to maintain sitting balance. Sitting exercise perform and pt able to follow the instructions. Fatigue presents after the exercise and pt unable to maintain sitting balance. +2 with trunk and BLE to transfer from sitting to supine. Vital monitored ans stable throughout the session with O2 91% and HR 68 at the end of the session. Pt would benefit from skilled PT to address noted impairments and functional limitations (see below for any additional details).  Upon hospital discharge, recommend pt discharge to SNF to improve strength, muscle endurance, balance to improve ADL's tolerance.      Follow Up Recommendations SNF    Equipment Recommendations  None recommended by PT    Recommendations for Other Services       Precautions / Restrictions Precautions Precautions: Fall Restrictions Weight Bearing Restrictions: Yes RLE Weight Bearing: Non weight bearing      Mobility  Bed Mobility Overal bed mobility: Needs Assistance Bed Mobility: Supine to Sit Sit to supine      Supine to sit: Mod assist;+2 for safety/equipment      General bed mobility comments: Pt requires heavy tactile and verbal cueing for hand and body placement. Requires +2 assistant to manage upper and lower body transfer from supine to sitting on EOB  Transfers                 General transfer comment: Deferred due to fagitue in sitting.  Ambulation/Gait                Stairs            Wheelchair Mobility    Modified Rankin (Stroke Patients Only)       Balance Overall balance assessment: Needs assistance Sitting-balance support: Bilateral upper extremity supported Sitting balance-Leahy Scale: Poor Sitting balance - Comments: Requires heavy tactile cueing for hand placement to maintain sitting balance. Limited due to fatigue Postural control: Posterior lean;Left lateral lean                                   Pertinent Vitals/Pain Pain Assessment: Faces Faces Pain Scale: Hurts little more Pain Location: R hip with mobility Pain Descriptors / Indicators: Sharp Pain Intervention(s): Limited activity within patient's tolerance;Monitored during session;Repositioned;Relaxation    Home Living Family/patient expects to be discharged to:: Assisted living                      Prior Function Level of Independence: Needs assistance   Gait / Transfers Assistance Needed: Wheelchair based transfer; non-ambulatory; 1 assistant for pivot transfer from bed to chair (information from call to assistive living)  Hand Dominance        Extremity/Trunk Assessment   Upper Extremity Assessment Upper Extremity Assessment: Difficult to assess due to impaired cognition(at least 3-/5 with BUE shoudler flexion, elbow flexion/extension, and strong grip strength.)   Lower Extremity Assessment  3-/5 AROM with BLE hip flexion, knee flexion/extension, and foot dorsiflexion/plantar flexion         Communication   Communication: Other (comment)(Orient to name)  Cognition Arousal/Alertness:  Lethargic Behavior During Therapy: Flat affect Overall Cognitive Status: History of cognitive impairments - at baseline                                 General Comments: Orient to person but not the time/location      General Comments General comments (skin integrity, edema, etc.): Skin at R hip is intact at the beginning and the end of the session.    Exercises Total Joint Exercises Ankle Circles/Pumps: AROM;AAROM;Both;5 reps;Supine Long Arc Quad: AROM;AAROM;Strengthening;Both;10 reps;Seated General Exercises - Lower Extremity Hip ABduction/ADduction: AROM;Strengthening;Both;10 reps;Seated Other Exercises Other Exercises: Sitting edurance exercise perfromed with multidirectional hand reaching x4 to increase core strength.   Assessment/Plan    PT Assessment Patient needs continued PT services  PT Problem List Decreased strength;Decreased range of motion;Decreased balance       PT Treatment Interventions Functional mobility training;Therapeutic activities;Therapeutic exercise;Balance training    PT Goals (Current goals can be found in the Care Plan section)  Acute Rehab PT Goals Patient Stated Goal: to increase sitting balance and improve strength PT Goal Formulation: Patient unable to participate in goal setting Time For Goal Achievement: 04/09/19 Potential to Achieve Goals: Fair    Frequency 7X/week   Barriers to discharge        Co-evaluation               AM-PAC PT "6 Clicks" Mobility  Outcome Measure Help needed turning from your back to your side while in a flat bed without using bedrails?: A Lot Help needed moving from lying on your back to sitting on the side of a flat bed without using bedrails?: A Lot Help needed moving to and from a bed to a chair (including a wheelchair)?: A Lot Help needed standing up from a chair using your arms (e.g., wheelchair or bedside chair)?: Total Help needed to walk in hospital room?: Total Help needed  climbing 3-5 steps with a railing? : Total 6 Click Score: 9    End of Session   Activity Tolerance: Patient limited by fatigue Patient left: in bed;with call bell/phone within reach;with bed alarm set(pillows between hips to maintain abduction and B heels floating off from towels.) Nurse Communication: Mobility status;Precautions;Weight bearing status PT Visit Diagnosis: Muscle weakness (generalized) (M62.81)    Time: [id number] PT Time Calculation (min) (ACUTE ONLY): 41 min   Charges:   PT Evaluation $PT Eval Low Complexity: 1 Low PT Treatments $Therapeutic Exercise: 8-22 mins $Therapeutic Activity: 8-22 mins          Nelly RoutNan Marquavis Hannen. SPT 03/26/2019, 5:01 PM

## 2019-03-27 LAB — SARS CORONAVIRUS 2 BY RT PCR (HOSPITAL ORDER, PERFORMED IN ~~LOC~~ HOSPITAL LAB): SARS Coronavirus 2: NEGATIVE

## 2019-03-27 MED ORDER — BISACODYL 10 MG RE SUPP
10.0000 mg | Freq: Once | RECTAL | Status: AC
  Administered 2019-03-27: 10 mg via RECTAL
  Filled 2019-03-27: qty 1

## 2019-03-27 MED ORDER — FLEET ENEMA 7-19 GM/118ML RE ENEM
1.0000 | ENEMA | Freq: Once | RECTAL | Status: AC
  Administered 2019-03-27: 1 via RECTAL

## 2019-03-27 NOTE — TOC Transition Note (Signed)
Transition of Care [facility]) - CM/SW Discharge Note   Patient Details  Name: ZANI KYLLONEN MRN: [id number] Date of Birth: February 17, 1920  [facility]) CM/SW Contact:  Su Hilt, RN Phone Number: 03/27/2019, 12:19 PM   Clinical Narrative:    Damaris Schooner with [facility] at [facility] and the room is 83 The bedside nurse will call report to [phone], will also call EMS Spoke with Elenore Rota the [facility]  He will arrange for someone to take the patient clothing and anything else she needs  Final next level of care: Skilled Nursing Facility Barriers to Discharge: Barriers Resolved   Patient Goals and CMS Choice        Discharge Placement                       Discharge Plan and Services                                     Social Determinants of Health (SDOH) Interventions     Readmission Risk Interventions No flowsheet data found.

## 2019-03-27 NOTE — Evaluation (Signed)
Clinical/Bedside Swallow Evaluation Patient Details  Name: Gloria Martinez MRN: [id number] Date of Birth: 05/05/1920  Today's Date: 03/27/2019 Time: SLP Start Time (ACUTE ONLY): 0900 SLP Stop Time (ACUTE ONLY): 1000 SLP Time Calculation (min) (ACUTE ONLY): 60 min  Past Medical History:  Past Medical History:  Diagnosis Date  . Anxiety   . CHF (congestive heart failure) (HCC)   . Coronary artery disease   . Dementia (HCC)   . Diverticulitis   . Dysphagia   . Epilepsy (HCC)   . Glaucoma   . Hyperlipidemia   . Hypertension   . Hypothyroidism   . Muscle weakness    Past Surgical History:  Past Surgical History:  Procedure Laterality Date  . ABDOMINAL HYSTERECTOMY    . Hip Replacement  Right 2009   HPI:  Pt is a 85 y.o. female who presents w/ Fall.  She has multiple medical issues as PMH including Dementia, Dysphagia, Epilepsy, CAD, CHF, anxiety.  Pt is currently on a Pureed diet w/ Nectar consistency liquids - suspect this could be baseline d/t pt's presenting medical status/history(?).  Patient had a fall at home and presents the ED afterwards with pelvic girdle pain.  She is found to have pelvic fractures and a posterior right acetabular fracture.  Hospitalist were called for admission.    Assessment / Plan / Recommendation Clinical Impression  Pt presents w/ a baseline h/o Dysphagia noted in PMH since prior to 2014. Currently, pt presents w/ declined medical and Cognitive status'(Dementia baseline per PMH) and appears at increased risk for aspiration thus Pulmonary decline. Pt required mod verbal cues for attention and follow through during po tasks - often noted phonations/moaning though she denied pain when asked. Pt was presented po trials of current diet as ordered: puree w/ Nectar liquids. Pt consumed trials of each including Nectar via straw w/ no overt s/s of aspiration noted; no decline in phonation vocal quality or respiratory status during/post trials. Oral phase was gross[facility]  [facility] and clearing of these consistenices. No trials of solid foods or thin liquids were presented at this evaluation d/t pt's current medical and Cognitive status' and risk for aspiration. OM exam appeared grossly WFL during oral movements w/ bolus management.  Recommend continue a Dysphagia level 1 (PUREE) w/ NECTAR consistency liquids w/ strict aspiration precautions; Pills Crushed in Puree; full Feeding Support at meals w/ Supervision during all oral intake. Recommend Dietician f/u for support. Pt can be followed while at Rehab if status improves for attempting diet upgrade, if safe for pt.  SLP Visit Diagnosis: Dysphagia, oropharyngeal phase (R13.12)    Aspiration Risk  Moderate aspiration risk;Risk for inadequate nutrition/hydration    Diet Recommendation  Dysphagia level 1 (PUREE) w/ NECTAR consistency liquids; aspiration precautions; feeding support  Medication Administration: Crushed with puree    Other  Recommendations Recommended Consults: (dietician f/u; Palliative Care f/u for GOC) Oral Care Recommendations: Oral care BID;Staff/trained caregiver to provide oral care Other Recommendations: Order thickener from pharmacy;Prohibited food (jello, ice cream, thin soups);Remove water pitcher;Have oral suction available   Follow up Recommendations Skilled Nursing facility      Frequency and Duration  TBD         Prognosis Prognosis for Safe Diet Advancement: Guarded Barriers to Reach Goals: Cognitive deficits;Time post onset;Severity of deficits(Overall Medical status )      Swallow Study   General Date of Onset: 03/24/19 HPI: Pt is a 85 y.o. female who presents w/ Fall.  She has multiple medical  issues as PMH including Dementia, Dysphagia, Epilepsy, CAD, CHF, anxiety.  Pt is currently on a Pureed diet w/ Nectar consistency liquids - suspect this could be baseline d/t pt's presenting medical status/history(?).  Patient had a fall at home and presents the ED  afterwards with pelvic girdle pain.  She is found to have pelvic fractures and a posterior right acetabular fracture.  Hospitalist were called for admission.  Type of Study: Bedside Swallow Evaluation Previous Swallow Assessment: none reported since BSE only in 2014 s/p fall(regular diet then) Diet Prior to this Study: Dysphagia 1 (puree);Nectar-thick liquids Temperature Spikes Noted: No(wbc 9.8) Respiratory Status: Room air History of Recent Intubation: No Behavior/Cognition: Alert;Cooperative;Confused;Distractible;Requires cueing(Dementia baseline) Oral Cavity Assessment: Dry Oral Care Completed by SLP: Recent completion by staff Oral Cavity - Dentition: Missing dentition Vision: (unsure) Self-Feeding Abilities: Total assist Patient Positioning: Upright in bed(needed positioning) Baseline Vocal Quality: Low vocal intensity Volitional Cough: Cognitively unable to elicit Volitional Swallow: Unable to elicit    Oral/Motor/Sensory Function Overall Oral Motor/Sensory Function: Within functional limits(grossly - for bolus management and clearing of trials)   Ice Chips Ice chips: Not tested   Thin Liquid Thin Liquid: Not tested    Nectar Thick Nectar Thick Liquid: Within functional limits Presentation: Cup;Self Fed;Straw(fully assisted; ~3 ozs total w/ both)   Honey Thick Honey Thick Liquid: Not tested   Puree Puree: Within functional limits Presentation: Spoon(fed; 10 trials including few w/ crushed Pills(NSG))   Solid     Solid: Not tested        Orinda Kenner, MS, CCC-SLP Gloria Martinez 03/27/2019,2:30 PM

## 2019-03-27 NOTE — Discharge Summary (Signed)
[facility] NAME: Mandy Peeks    MR#:  [id number]  DATE OF BIRTH:  1919/09/27  DATE OF ADMISSION:  03/24/2019 ADMITTING PHYSICIAN: Lance Coon, MD  DATE OF DISCHARGE: 03/27/2019  PRIMARY CARE PHYSICIAN: Kirk Ruths, MD    ADMISSION DIAGNOSIS:  Pain in pelvis [R10.2] Fall, initial encounter [W19.XXXA] Closed nondisplaced fracture of pelvis, unspecified part of pelvis, initial encounter ([place]) [S32.9XXA]  DISCHARGE DIAGNOSIS:  Principal Problem:   Pelvic fracture (HCC) Active Problems:   Hypothyroidism   HTN (hypertension)   HLD (hyperlipidemia)   CAD (coronary artery disease)   SECONDARY DIAGNOSIS:   Past Medical History:  Diagnosis Date  . Anxiety   . CHF (congestive heart failure) ([place])   . Coronary artery disease   . Dementia ([place])   . Diverticulitis   . Dysphagia   . Epilepsy ([place])   . Glaucoma   . Hyperlipidemia   . Hypertension   . Hypothyroidism   . Muscle weakness     HOSPITAL COURSE:   85 year old female with past medical history of dementia, coronary artery disease, glaucoma, hypertension, hyperlipidemia, hypothyroidism, history of CHF, anxiety who presented to the hospital after a fall and noted to have a pelvic and right acetabular fracture.  1.  Status post fall with pelvic and acetabular fracture on the right-  patient presented to the hospital from an assisted living with a fall and noted to have fractures as stated above. -An orthopedic consult was obtained.  Did not recommend surgical intervention.  The recommended pain control and physical therapy as tolerated. -Patient's pain is controlled with oral Tylenol and PT is recommending higher level of care than assisted living so she is being discharged to a skilled nursing facility.  2.  Essential hypertension- she will continue Toprol, Norvasc. -Patient's blood pressure stable while in the hospital.   3.  Hypothyroidism- we will continue  Synthroid.  4.  History of glaucoma-continue Alphagan, Xalantan.   5. Depression -patient will cont. Celexa.   6.  History of chronic diastolic CHF-clinically patient is not in congestive heart failure while in the hospital. -She will continue her Lasix, metoprolol, Norvasc.  DISCHARGE CONDITIONS:   Stable.   CONSULTS OBTAINED:    DRUG ALLERGIES:   Allergies  Allergen Reactions  . Ciprofloxacin   . Macrobid [employer] Macro]   . Penicillins   . Percocet [Oxycodone-Acetaminophen]   . Sulfa Antibiotics   . Tussionex Pennkinetic Er [Hydrocod Polst-Cpm Polst Er]   . Vicodin [Hydrocodone-Acetaminophen]     DISCHARGE MEDICATIONS:   Allergies as of 03/27/2019      Reactions   Ciprofloxacin    Macrobid [nitrofurantoin Monohyd Macro]    Penicillins    Percocet [oxycodone-acetaminophen]    Sulfa Antibiotics    Tussionex Pennkinetic Er [hydrocod Polst-cpm Polst Er]    Vicodin [hydrocodone-acetaminophen]       Medication List    TAKE these medications   acetaminophen 325 MG tablet Commonly known as: TYLENOL Take 650 mg by mouth every 6 (six) hours as needed for mild pain or fever.   alum & mag hydroxide-simeth 200-200-20 MG/5ML suspension Commonly known as: MAALOX/MYLANTA Take 30 mLs by mouth every 6 (six) hours as needed for indigestion or heartburn.   amLODipine 5 MG tablet Commonly known as: NORVASC Take 5 mg by mouth daily.   aspirin 81 MG chewable tablet Chew 81 mg by mouth daily.   barrier cream Crea Commonly known as: non-specified Apply 1  application topically as needed (prevention of skin breakdown).   brimonidine 0.2 % ophthalmic solution Commonly known as: ALPHAGAN Place 1 drop into both eyes 2 (two) times daily.   citalopram 10 MG tablet Commonly known as: CELEXA Take 10 mg by mouth daily.   D3-1000 25 MCG (1000 UT) tablet Generic drug: Cholecalciferol Take 2,000 Units by mouth daily.   diphenhydrAMINE 25 MG tablet Commonly  known as: BENADRYL Take 25 mg by mouth every 6 (six) hours as needed for allergies.   furosemide 20 MG tablet Commonly known as: LASIX Take 20 mg by mouth daily.   GOLD BOND EX Apply 1 application topically daily as needed (itching). May use powder or lotion   guaiFENesin 100 MG/5ML liquid Commonly known as: ROBITUSSIN Take 200 mg by mouth 4 (four) times daily as needed for cough.   hydrocortisone cream 1 % Apply 1 application topically daily as needed for itching.   ipratropium 0.03 % nasal spray Commonly known as: ATROVENT Place 2 sprays into both nostrils 3 (three) times daily as needed for congestion.   latanoprost 0.005 % ophthalmic solution Commonly known as: XALATAN Place 1 drop into the right eye at bedtime.   levothyroxine 100 MCG tablet Commonly known as: SYNTHROID Take 100 mcg by mouth daily before breakfast.   loperamide 2 MG tablet Commonly known as: IMODIUM A-D Take 4 mg by mouth 4 (four) times daily as needed for diarrhea or loose stools.   Lubricant Eye Drops 0.4-0.3 % Soln Generic drug: Polyethyl Glycol-Propyl Glycol Place 1 drop into both eyes 2 (two) times daily.   magnesium hydroxide 400 MG/5ML suspension Commonly known as: MILK OF MAGNESIA Take 30 mLs by mouth daily as needed for mild constipation.   Melatonin 3 MG Tabs Take 3 mg by mouth at bedtime.   metoprolol succinate 100 MG 24 hr tablet Commonly known as: TOPROL-XL Take 100 mg by mouth daily. Take with or immediately following a meal.   neomycin-bacitracin-polymyxin ointment Commonly known as: NEOSPORIN Apply 1 application topically as needed (skin abrasions/ minor skin tears).   polyethylene glycol 17 g packet Commonly known as: MIRALAX / GLYCOLAX Take 17 g by mouth every other day.   potassium chloride 10 MEQ tablet Commonly known as: K-DUR Take 10 mEq by mouth daily.   pyrithione zinc 1 % shampoo Commonly known as: HEAD AND SHOULDERS Apply 1 application topically daily as  needed (scalp irritation/ dandruff).   senna-docusate 8.6-50 MG tablet Commonly known as: Senokot-S Take 1 tablet by mouth 2 (two) times daily.   traZODone 50 MG tablet Commonly known as: DESYREL Take 50 mg by mouth at bedtime.         DISCHARGE INSTRUCTIONS:   DIET:  Cardiac diet  Dysphagia level 1(puree) w/ Nectar liquids; aspiration precautions; Pills Crushed in Puree; Feeding Support at meals.    DISCHARGE CONDITION:  Stable  ACTIVITY:  Activity as tolerated  OXYGEN:  Home Oxygen: No.   Oxygen Delivery: room air  DISCHARGE LOCATION:  nursing home   If you experience worsening of your admission symptoms, develop shortness of breath, life threatening emergency, suicidal or homicidal thoughts you must seek medical attention immediately by calling 911 or calling your MD immediately  if symptoms less severe.  You Must read complete instructions/literature along with all the possible adverse reactions/side effects for all the Medicines you take and that have been prescribed to you. Take any new Medicines after you have completely understood and accpet all the possible adverse reactions/side effects.  Please note  You were cared for by a hospitalist during your hospital stay. If you have any questions about your discharge medications or the care you received while you were in the hospital after you are discharged, you can call the unit and asked to speak with the hospitalist on call if the hospitalist that took care of you is not available. Once you are discharged, your primary care physician will handle any further medical issues. Please note that NO REFILLS for any discharge medications will be authorized once you are discharged, as it is imperative that you return to your primary care physician (or establish a relationship with a primary care physician if you do not have one) for your aftercare needs so that they can reassess your need for medications and monitor your lab  values.     Today   No acute events overnight, denies any pain, tolerating p.o. well and currently on a dysphagia 1 diet with nectar thick liquids.  Will discharge to a skilled nursing facility today.  VITAL SIGNS:  Blood pressure 131/69, pulse 65, temperature 97.8 F (36.6 C), temperature source Oral, resp. rate 20, height 5\' 3"  (1.6 m), weight 49.9 kg, SpO2 97 %.  I/O:    Intake/Output Summary (Last 24 hours) at 03/27/2019 1204 Last data filed at 03/27/2019 0952 Gross per 24 hour  Intake 20 ml  Output 200 ml  Net -180 ml    PHYSICAL EXAMINATION:   GENERAL:  85 y.o.-year-old demented patient lying in bed in a contracted position.  EYES: No scleral icterus. Extraocular muscles intact.  HEENT: Head atraumatic, normocephalic. Oropharynx and nasopharynx clear. Bruising on the right eye/head area.  NECK:  Supple, no jugular venous distention. No thyroid enlargement, no tenderness.  LUNGS: Normal breath sounds bilaterally, no wheezing, rales, rhonchi. No use of accessory muscles of respiration.  CARDIOVASCULAR: S1, S2 normal. No murmurs, rubs, or gallops.  ABDOMEN: Soft, nontender, nondistended. Bowel sounds present. No organomegaly or mass.  EXTREMITIES: No cyanosis, clubbing or edema b/l.    NEUROLOGIC: Cranial nerves II through XII are intact. No focal Motor or sensory deficits b/l. Globally weak.   PSYCHIATRIC: The patient is alert and oriented x 1.  SKIN: No obvious rash, lesion, or ulcer.    DATA REVIEW:   CBC Recent Labs  Lab 03/25/19 0725  WBC 9.8  HGB 13.7  HCT 40.5  PLT 289    Chemistries  Recent Labs  Lab 03/24/19 2115 03/25/19 0725  NA 136 137  K 3.8 4.0  CL 107 108  CO2 19* 20*  GLUCOSE 116* 110*  BUN 20 17  CREATININE 1.07* 0.96  CALCIUM 9.0 9.0  AST 23  --   ALT 14  --   ALKPHOS 73  --   BILITOT 0.8  --     Cardiac Enzymes No results for input(s): TROPONINI in the last 168 hours.  Microbiology Results  Results for orders placed or  performed during the hospital encounter of 03/24/19  SARS CORONAVIRUS 2 Nasal Swab Aptima Multi Swab     Status: None   Collection Time: 03/24/19 11:17 PM   Specimen: Aptima Multi Swab; Nasal Swab  Result Value Ref Range Status   SARS Coronavirus 2 NEGATIVE NEGATIVE Final    Comment: (NOTE) SARS-CoV-2 target nucleic acids are NOT DETECTED. The SARS-CoV-2 RNA is generally detectable in upper and lower respiratory specimens during the acute phase of infection. Negative results do not preclude SARS-CoV-2 infection, do not rule out co-infections with other pathogens,  and should not be used as the sole basis for treatment or other patient management decisions. Negative results must be combined with clinical observations, patient history, and epidemiological information. The expected result is Negative. Fact Sheet for Patients: HairSlick.nohttps://www.fda.gov/media/138098/download Fact Sheet for Healthcare Providers: quierodirigir.comhttps://www.fda.gov/media/138095/download This test is not yet approved or cleared by the [place] FDA and  has been authorized for detection and/or diagnosis of SARS-CoV-2 by FDA under an Emergency Use Authorization (EUA). This EUA will remain  in effect (meaning this test can be used) for the duration of the COVID-19 declaration under Section 56 4(b)(1) of the Act, 21 U.S.C. section 360bbb-3(b)(1), unless the authorization is terminated or revoked sooner. Performed at [facility], [address] [place], [place] [address]   Surgical pcr screen     Status: None   Collection Time: 03/25/19  7:39 AM   Specimen: Nasal Mucosa; Nasal Swab  Result Value Ref Range Status   MRSA, PCR NEGATIVE NEGATIVE Final   Staphylococcus aureus NEGATIVE NEGATIVE Final    Comment: (NOTE) The Xpert SA Assay (FDA approved for NASAL specimens in patients 83 years of age and older), is one component of a comprehensive surveillance program. It is not intended to diagnose infection nor to guide  or monitor treatment. Performed at [facility], [address] [place], [place] [address]   SARS Coronavirus 2 [facility](Hospital order, Performed in [facility]) Nasopharyngeal Nasopharyngeal Swab     Status: None   Collection Time: 03/27/19 10:15 AM   Specimen: Nasopharyngeal Swab  Result Value Ref Range Status   SARS Coronavirus 2 NEGATIVE NEGATIVE Final    Comment: (NOTE) If result is NEGATIVE SARS-CoV-2 target nucleic acids are NOT DETECTED. The SARS-CoV-2 RNA is generally detectable in upper and lower  respiratory specimens during the acute phase of infection. The lowest  concentration of SARS-CoV-2 viral copies this assay can detect is 250  copies / mL. A negative result does not preclude SARS-CoV-2 infection  and should not be used as the sole basis for treatment or other  patient management decisions.  A negative result may occur with  improper specimen collection / handling, submission of specimen other  than nasopharyngeal swab, presence of viral mutation(s) within the  areas targeted by this assay, and inadequate number of viral copies  (<250 copies / mL). A negative result must be combined with clinical  observations, patient history, and epidemiological information. If result is POSITIVE SARS-CoV-2 target nucleic acids are DETECTED. The SARS-CoV-2 RNA is generally detectable in upper and lower  respiratory specimens dur ing the acute phase of infection.  Positive  results are indicative of active infection with SARS-CoV-2.  Clinical  correlation with patient history and other diagnostic information is  necessary to determine patient infection status.  Positive results do  not rule out bacterial infection or co-infection with other viruses. If result is PRESUMPTIVE POSTIVE SARS-CoV-2 nucleic acids MAY BE PRESENT.   A presumptive positive result was obtained on the submitted specimen  and confirmed on repeat testing.  While 2019 novel coronavirus   (SARS-CoV-2) nucleic acids may be present in the submitted sample  additional confirmatory testing may be necessary for epidemiological  and / or clinical management purposes  to differentiate between  SARS-CoV-2 and other Sarbecovirus currently known to infect humans.  If clinically indicated additional testing with an alternate test  methodology [phone]([id number]) is advised. The SARS-CoV-2 RNA is generally  detectable in upper and lower respiratory sp ecimens during the acute  phase of infection. The expected result is Negative. Fact Sheet for Patients:  BoilerBrush.com.cyhttps://www.fda.gov/media/136312/download Fact Sheet for Healthcare Providers: https://pope.com/https://www.fda.gov/media/136313/download This test is not yet approved or cleared by the [place] FDA and has been authorized for detection and/or diagnosis of SARS-CoV-2 by FDA under an Emergency Use Authorization (EUA).  This EUA will remain in effect (meaning this test can be used) for the duration of the COVID-19 declaration under Section 564(b)(1) of the Act, 21 U.S.C. section 360bbb-3(b)(1), unless the authorization is terminated or revoked sooner. Performed at [facility], [address] [place], [place] [address]     RADIOLOGY:  No results found.    Management plans discussed with the patient, family and they are in agreement.  CODE STATUS:     Code Status Orders  (From admission, onward)         Start     Ordered   03/25/19 0109  Do not attempt resuscitation (DNR)  Continuous    Question Answer Comment  In the event of cardiac or respiratory ARREST Do not call a "code blue"   In the event of cardiac or respiratory ARREST Do not perform Intubation, CPR, defibrillation or ACLS   In the event of cardiac or respiratory ARREST Use medication by any route, position, wound care, and other measures to relive pain and suffering. May use oxygen, suction and manual treatment of airway obstruction as needed for comfort.       03/25/19 0108       TOTAL TIME TAKING CARE OF THIS PATIENT: 40 minutes.    Houston SirenVivek J  M.D on 03/27/2019 at 12:04 PM  Between 7am to 6pm - Pager - [phone]  After 6pm go to [email or web address] - Scientist, research (life sciences)password EPAS [facility]  [facility]  Office  [phone]  CC: Primary care physician; Lauro RegulusAnderson, Marshall W, MD

## 2019-03-27 NOTE — Progress Notes (Signed)
EMS is here to transport pt to [facility] in [place], report given to [employer]. Leonidas Romberg made aware of DC.

## 2019-08-19 ENCOUNTER — Other Ambulatory Visit: Payer: Self-pay

## 2019-08-19 ENCOUNTER — Emergency Department
Admission: EM | Admit: 2019-08-19 | Discharge: 2019-08-19 | Disposition: A | Discharge status: Skilled Nursing Facility | Payer: Medicare Other | Attending: Emergency Medicine | Admitting: Emergency Medicine

## 2019-08-19 ENCOUNTER — Emergency Department: Payer: Medicare Other

## 2019-08-19 DIAGNOSIS — E039 Hypothyroidism, unspecified: Secondary | ICD-10-CM | POA: Insufficient documentation

## 2019-08-19 DIAGNOSIS — Y92129 Unspecified place in nursing home as the place of occurrence of the external cause: Secondary | ICD-10-CM | POA: Diagnosis not present

## 2019-08-19 DIAGNOSIS — I110 Hypertensive heart disease with heart failure: Secondary | ICD-10-CM | POA: Diagnosis not present

## 2019-08-19 DIAGNOSIS — Z9181 History of falling: Secondary | ICD-10-CM | POA: Diagnosis not present

## 2019-08-19 DIAGNOSIS — Z96641 Presence of right artificial hip joint: Secondary | ICD-10-CM | POA: Diagnosis not present

## 2019-08-19 DIAGNOSIS — F0390 Unspecified dementia without behavioral disturbance: Secondary | ICD-10-CM | POA: Diagnosis present

## 2019-08-19 DIAGNOSIS — N3001 Acute cystitis with hematuria: Secondary | ICD-10-CM | POA: Diagnosis not present

## 2019-08-19 DIAGNOSIS — Z79899 Other long term (current) drug therapy: Secondary | ICD-10-CM | POA: Diagnosis not present

## 2019-08-19 DIAGNOSIS — W19XXXA Unspecified fall, initial encounter: Secondary | ICD-10-CM | POA: Diagnosis not present

## 2019-08-19 DIAGNOSIS — I509 Heart failure, unspecified: Secondary | ICD-10-CM | POA: Insufficient documentation

## 2019-08-19 LAB — COMPREHENSIVE METABOLIC PANEL
ALT: 17 U/L (ref 0–44)
AST: 31 U/L (ref 15–41)
Albumin: 3.6 g/dL (ref 3.5–5.0)
Alkaline Phosphatase: 60 U/L (ref 38–126)
Anion gap: 8 (ref 5–15)
BUN: 17 mg/dL (ref 8–23)
CO2: 25 mmol/L (ref 22–32)
Calcium: 9 mg/dL (ref 8.9–10.3)
Chloride: 104 mmol/L (ref 98–111)
Creatinine, Ser: 0.65 mg/dL (ref 0.44–1.00)
GFR calc Af Amer: >60 mL/min (ref >60)
GFR calc non Af Amer: >60 mL/min (ref >60)
Glucose, Bld: 87 mg/dL (ref 70–99)
Potassium: 4.1 mmol/L (ref 3.5–5.1)
Sodium: 137 mmol/L (ref 135–145)
Total Bilirubin: 0.9 mg/dL (ref 0.3–1.2)
Total Protein: 7 g/dL (ref 6.5–8.1)

## 2019-08-19 LAB — CBC WITH DIFFERENTIAL/PLATELET
Abs Immature Granulocytes: 0.02 10*3/uL (ref 0.00–0.07)
Basophils Absolute: 0 10*3/uL (ref 0.0–0.1)
Basophils Relative: 1 %
Eosinophils Absolute: 0.1 10*3/uL (ref 0.0–0.5)
Eosinophils Relative: 3 %
HCT: 36.3 % (ref 36.0–46.0)
Hemoglobin: 11.9 g/dL — ABNORMAL LOW (ref 12.0–15.0)
Immature Granulocytes: 0 %
Lymphocytes Relative: 34 %
Lymphs Abs: 1.8 10*3/uL (ref 0.7–4.0)
MCH: 33.8 pg (ref 26.0–34.0)
MCHC: 32.8 g/dL (ref 30.0–36.0)
MCV: 103.1 fL — ABNORMAL HIGH (ref 80.0–100.0)
Monocytes Absolute: 0.8 10*3/uL (ref 0.1–1.0)
Monocytes Relative: 14 %
Neutro Abs: 2.6 10*3/uL (ref 1.7–7.7)
Neutrophils Relative %: 48 %
Platelets: 195 10*3/uL (ref 150–400)
RBC: 3.52 MIL/uL — ABNORMAL LOW (ref 3.87–5.11)
RDW: 14.9 % (ref 11.5–15.5)
WBC: 5.3 10*3/uL (ref 4.0–10.5)
nRBC: 0 % (ref 0.0–0.2)

## 2019-08-19 LAB — URINALYSIS, COMPLETE (UACMP) WITH MICROSCOPIC
Bilirubin Urine: NEGATIVE
Glucose, UA: NEGATIVE mg/dL
Ketones, ur: NEGATIVE mg/dL
Nitrite: POSITIVE — AB
Protein, ur: 30 mg/dL — AB
Specific Gravity, Urine: 1.02 (ref 1.005–1.030)
WBC, UA: >50 WBC/hpf — ABNORMAL HIGH (ref 0–5)
pH: 6 (ref 5.0–8.0)

## 2019-08-19 MED ORDER — CEPHALEXIN 500 MG PO CAPS: 500.0000 mg | ORAL_CAPSULE | Freq: Three times a day (TID) | ORAL | 0 refills | Status: AC

## 2019-08-19 MED ORDER — CEPHALEXIN 500 MG PO CAPS
500.0000 mg | ORAL_CAPSULE | Freq: Once | ORAL | Status: AC
  Administered 2019-08-19: 500 mg via ORAL
  Filled 2019-08-19: qty 1

## 2019-08-19 NOTE — ED Notes (Signed)
Patient transported to CT 

## 2019-08-19 NOTE — ED Notes (Signed)
Pt discharged with ems to the [facility] with dx of UTI - pt is unable to sign for transfer d/t dementia

## 2019-08-19 NOTE — ED Triage Notes (Signed)
Pt arrives via [facility] from [facility] with cc of an unwitnessed fall. Pt was found laying on the ground. Pt had R hip fracture 3 months. R leg is normally contracted according to facility. Pt has dementia.   EMS attempted to reach son, POA, and family member w no answer.

## 2019-08-19 NOTE — Discharge Instructions (Addendum)

## 2019-08-19 NOTE — ED Notes (Signed)
Pt discharged with EMS for transport back to the [facility] with dx of UTI

## 2019-08-19 NOTE — ED Provider Notes (Signed)
[facility] Emergency Department Provider Note  ____________________________________________  Time seen: Approximately 5:09 AM  I have reviewed the triage vital signs and the nursing notes.   HISTORY  Chief Complaint Fall   HPI Gloria Martinez is a 86 y.o. female with a history of advanced dementia, CHF, CAD, hypertension, hyperlipidemia who presents from her nursing home after a unwitnessed fall.  Patient was found on the floor by staff.  Patient is alert and oriented to self only which is her baseline.  She has no complaints at this time and denies headache, neck pain, back pain, chest pain, abdominal pain, extremity pain.  She has no recollection of the fall.  Patient has a history of frequent falls.   Past Medical History:  Diagnosis Date   Anxiety    CHF (congestive heart failure) (HCC)    Coronary artery disease    Dementia (HCC)    Diverticulitis    Dysphagia    Epilepsy (HCC)    Glaucoma    Hyperlipidemia    Hypertension    Hypothyroidism    Muscle weakness     Patient Active Problem List   Diagnosis Date Noted   Pelvic fracture (HCC) 03/24/2019   Hypothyroidism 03/24/2019   HTN (hypertension) 03/24/2019   HLD (hyperlipidemia) 03/24/2019   CAD (coronary artery disease) 03/24/2019   Hip fracture (HCC) 08/09/2013    Past Surgical History:  Procedure Laterality Date   ABDOMINAL HYSTERECTOMY     Hip Replacement  Right 2009    Prior to Admission medications   Medication Sig Start Date End Date Taking? Authorizing Provider  acetaminophen (TYLENOL) 325 MG tablet Take 650 mg by mouth every 6 (six) hours as needed for mild pain or fever.     [provider]  alum & mag hydroxide-simeth (MAALOX/MYLANTA) 200-200-20 MG/5ML suspension Take 30 mLs by mouth every 6 (six) hours as needed for indigestion or heartburn.    [provider]  amLODipine (NORVASC) 5 MG tablet Take 5 mg by mouth daily.    [provider]  aspirin 81 MG chewable tablet Chew 81 mg by mouth daily.    [provider]  barrier cream (NON-SPECIFIED) CREA Apply 1 application topically as needed (prevention of skin breakdown).    [provider]  brimonidine (ALPHAGAN) 0.2 % ophthalmic solution Place 1 drop into both eyes 2 (two) times daily.    [provider]  cephALEXin (KEFLEX) 500 MG capsule Take 1 capsule (500 mg total) by mouth 3 (three) times daily for 7 days. 08/19/19 08/26/19  Nita Sickle, MD  Cholecalciferol (D3-1000) 1000 units tablet Take 2,000 Units by mouth daily.     [provider]  citalopram (CELEXA) 10 MG tablet Take 10 mg by mouth daily.    [provider]  diphenhydrAMINE (BENADRYL) 25 MG tablet Take 25 mg by mouth every 6 (six) hours as needed for allergies.    [provider]  furosemide (LASIX) 20 MG tablet Take 20 mg by mouth daily.    [provider]  guaiFENesin (ROBITUSSIN) 100 MG/5ML liquid Take 200 mg by mouth 4 (four) times daily as needed for cough.    [provider]  hydrocortisone cream 1 % Apply 1 application topically daily as needed for itching.    [provider]  ipratropium (ATROVENT) 0.03 % nasal spray Place 2 sprays into both nostrils 3 (three) times daily as needed for congestion. 09/01/18   [provider]  latanoprost (XALATAN) 0.005 %  ophthalmic solution Place 1 drop into the right eye at bedtime.     [provider]  levothyroxine (SYNTHROID, LEVOTHROID) 100 MCG tablet Take 100 mcg by mouth daily before breakfast.     [provider]  loperamide (IMODIUM A-D) 2 MG tablet Take 4 mg by mouth 4 (four) times daily as needed for diarrhea or loose stools.    [provider]  magnesium hydroxide (MILK OF MAGNESIA) 400 MG/5ML suspension Take 30 mLs by mouth daily as needed for mild constipation.    [provider]  Melatonin 3 MG TABS Take 3 mg by mouth at  bedtime.    [provider]  Menthol-Zinc Oxide (GOLD BOND EX) Apply 1 application topically daily as needed (itching). May use powder or lotion    [provider]  metoprolol succinate (TOPROL-XL) 100 MG 24 hr tablet Take 100 mg by mouth daily. Take with or immediately following a meal.    [provider]  neomycin-bacitracin-polymyxin (NEOSPORIN) ointment Apply 1 application topically as needed (skin abrasions/ minor skin tears).    [provider]  Polyethyl Glycol-Propyl Glycol (LUBRICANT EYE DROPS) 0.4-0.3 % SOLN Place 1 drop into both eyes 2 (two) times daily.    [provider]  polyethylene glycol (MIRALAX / GLYCOLAX) packet Take 17 g by mouth every other day.    [provider]  potassium chloride (K-DUR) 10 MEQ tablet Take 10 mEq by mouth daily.    [provider]  pyrithione zinc (HEAD AND SHOULDERS) 1 % shampoo Apply 1 application topically daily as needed (scalp irritation/ dandruff).    [provider]  senna-docusate (SENOKOT-S) 8.6-50 MG tablet Take 1 tablet by mouth 2 (two) times daily.    [provider]  traZODone (DESYREL) 50 MG tablet Take 50 mg by mouth at bedtime.     [provider]    Allergies Ciprofloxacin, Macrobid [nitrofurantoin monohyd macro], Penicillins, Percocet [oxycodone-acetaminophen], Sulfa antibiotics, Tussionex pennkinetic er [hydrocod polst-cpm polst er], and Vicodin [hydrocodone-acetaminophen]  No family history on file.  Social History Social History   Tobacco Use   Smoking status: Never Smoker   Smokeless tobacco: Never Used  Substance Use Topics   Alcohol use: No   Drug use: No    Review of Systems  Constitutional: Negative for fever. ENT: Negative for facial injury or neck injury Cardiovascular: Negative for chest injury. Respiratory: Negative for shortness of breath. Negative for chest wall injury. Gastrointestinal: Negative for abdominal pain  or injury. Genitourinary: Negative for dysuria. Musculoskeletal: Negative for back injury, negative for arm or leg pain. Skin: Negative for laceration/abrasions. Neurological: Negative for head injury.   ____________________________________________   PHYSICAL EXAM:  VITAL SIGNS: ED Triage Vitals  Enc Vitals Group     BP 08/19/19 0455 (!) 178/77     Pulse Rate 08/19/19 0455 (!) 51     Resp 08/19/19 0455 10     Temp 08/19/19 0455 (!) 96.9 F (36.1 C)     Temp Source 08/19/19 0455 Axillary     SpO2 08/19/19 0455 99 %     Weight 08/19/19 0451 120 lb (54.4 kg)     Height 08/19/19 0451 5\' 3"  (1.6 m)     Head Circumference --      Peak Flow --      Pain Score --      Pain Loc --      Pain Edu? --      Excl. in GC? --  Full spinal precautions maintained throughout the trauma exam. Constitutional: Alert and oriented to self only. No acute distress. Does not appear intoxicated. HEENT Head: Normocephalic and atraumatic. Face: No facial bony tenderness. Stable midface Ears: No hemotympanum bilaterally. No Battle sign Eyes: No eye injury. PERRL. No raccoon eyes Nose: Nontender. No epistaxis. No rhinorrhea Mouth/Throat: Mucous membranes are moist. No oropharyngeal blood. No dental injury. Airway patent without stridor. Normal voice. Neck: no C-collar. No midline c-spine tenderness.  Cardiovascular: Bradycardia, regular rhythm. Normal and symmetric distal pulses are present in all extremities. Pulmonary/Chest: Chest wall is stable and nontender to palpation/compression. Normal respiratory effort. Breath sounds are normal. No crepitus.  Abdominal: Soft, nontender, non distended. Musculoskeletal: Patient holds her RLE bent and internally rotated at the hip which per EMS is her baseline. Nontender with normal full range of motion in all extremities. No deformities. No thoracic or lumbar midline spinal tenderness. Pelvis is stable. Skin: Skin is warm, dry and intact. No abrasions or  contutions. Psychiatric: Speech and behavior are appropriate. Neurological: Normal speech and language. Moves all extremities to command. No gross focal neurologic deficits are appreciated.  Glascow Coma Score: 4 - Opens eyes on own 6 - Follows simple motor commands 4 - Seems confused, disoriented GCS: 14   ____________________________________________   LABS (all labs ordered are listed, but only abnormal results are displayed)  Labs Reviewed  CBC WITH DIFFERENTIAL/PLATELET - Abnormal; Notable for the following components:      Result Value   RBC 3.52 (*)    Hemoglobin 11.9 (*)    MCV 103.1 (*)    All other components within normal limits  URINALYSIS, COMPLETE (UACMP) WITH MICROSCOPIC - Abnormal; Notable for the following components:   Color, Urine YELLOW (*)    APPearance TURBID (*)    Hgb urine dipstick SMALL (*)    Protein, ur 30 (*)    Nitrite POSITIVE (*)    Leukocytes,Ua SMALL (*)    WBC, UA >50 (*)    Bacteria, UA MANY (*)    All other components within normal limits  URINE CULTURE  COMPREHENSIVE METABOLIC PANEL   ____________________________________________  EKG  ED ECG REPORT I, Nita Sickle, the attending physician, personally viewed and interpreted this ECG.  Sinus bradycardia, rate of 52, frequent PVCs, normal QTC, normal axis, no ST elevations or depressions, low voltage QRS.  Bradycardia is new otherwise no significant changes. ____________________________________________  RADIOLOGY  I have personally reviewed the images performed during this visit and I agree with the Radiologist's read.   Interpretation by Radiologist:  CT Head Wo Contrast  Result Date: 08/19/2019 CLINICAL DATA:  Dementia patient post unwitnessed fall. Found on ground. EXAM: CT HEAD WITHOUT CONTRAST TECHNIQUE: Contiguous axial images were obtained from the base of the skull through the vertex without intravenous contrast. COMPARISON:  Head CT 03/24/2019 FINDINGS: Brain: No  intracranial hemorrhage, mass effect, or midline shift. Stable degree of atrophy. No hydrocephalus. The basilar cisterns are patent. Possible subcentimeter extra-axial calcified meningioma in the left skull base. Moderate chronic small vessel ischemia. Remote lacunar infarcts in the left caudate and bilateral cerebella. No evidence of territorial infarct or acute ischemia. No extra-axial or intracranial fluid collection. Vascular: Dolichoectasia. Atherosclerosis of skull base vasculature. No hyperdense vessel. Skull: No fracture or focal lesion. Sinuses/Orbits: Paranasal sinuses and mastoid air cells are clear. The visualized orbits are unremarkable. Bilateral cataract resection. Other: None. IMPRESSION: 1. No acute intracranial abnormality. No skull fracture. 2. Stable atrophy and chronic small vessel ischemia. Remote lacunar  infarcts. Electronically Signed   By: Keith Rake M.D.   On: 08/19/2019 05:32   DG Hip Unilat W or Wo Pelvis 2-3 Views Left  Result Date: 08/19/2019 CLINICAL DATA:  Unwitnessed fall. Right hip fracture 3 months ago. EXAM: DG HIP (WITH OR WITHOUT PELVIS) 2-3V LEFT; DG HIP (WITH OR WITHOUT PELVIS) 2-3V RIGHT COMPARISON:  Radiograph and pelvic CT 03/24/2019 FINDINGS: Right hip: Right hip arthroplasty in expected alignment. No periprosthetic fracture. Cortical lucency about the lateral proximal femur corresponds to a nutrient channel on prior CT, and was seen on prior exam. The previous inferior and superior pubic rami fractures are no longer seen. Left hip: No evidence of acute fracture. Femoral head is well seated. Left pubic rami are intact. Pelvis: Diffuse bony under mineralization. Stool ball distends the rectum. IMPRESSION: 1. Intact right hip arthroplasty without evidence of hardware complication. Previous right pubic rami fractures have healed. 2. Cortical lucency about the lateral proximal right femur corresponds to a nutrient channel on prior CT. 3. No acute fracture of the left  hip. Electronically Signed   By: Keith Rake M.D.   On: 08/19/2019 05:55   DG Hip Unilat W or Wo Pelvis 2-3 Views Right  Result Date: 08/19/2019 CLINICAL DATA:  Unwitnessed fall. Right hip fracture 3 months ago. EXAM: DG HIP (WITH OR WITHOUT PELVIS) 2-3V LEFT; DG HIP (WITH OR WITHOUT PELVIS) 2-3V RIGHT COMPARISON:  Radiograph and pelvic CT 03/24/2019 FINDINGS: Right hip: Right hip arthroplasty in expected alignment. No periprosthetic fracture. Cortical lucency about the lateral proximal femur corresponds to a nutrient channel on prior CT, and was seen on prior exam. The previous inferior and superior pubic rami fractures are no longer seen. Left hip: No evidence of acute fracture. Femoral head is well seated. Left pubic rami are intact. Pelvis: Diffuse bony under mineralization. Stool ball distends the rectum. IMPRESSION: 1. Intact right hip arthroplasty without evidence of hardware complication. Previous right pubic rami fractures have healed. 2. Cortical lucency about the lateral proximal right femur corresponds to a nutrient channel on prior CT. 3. No acute fracture of the left hip. Electronically Signed   By: Keith Rake M.D.   On: 08/19/2019 05:55      ____________________________________________   PROCEDURES  Procedure(s) performed: None Procedures Critical Care performed:  None ____________________________________________   INITIAL IMPRESSION / ASSESSMENT AND PLAN / ED COURSE  86 y.o. female with a history of advanced dementia, frequent falls, CHF, CAD, hypertension, hyperlipidemia who presents from her nursing home after a unwitnessed fall.  A&Ox1 which is baseline. Patient has no complaints and denies pain. Has no recollection of the fall. RLE is held flexed and internally rotated at the hip which seems to be baseline but limits exam. No obvious signs of trauma on exam. Will get bilateral hip x-rays and head CT to rule out any injuries.  Will check labs, EKG, UA to rule out  UTI, anemia, AKI, electrolyte abnormalities, dysrhythmias, or infection as possible causes of patient's fall.    _________________________ 6:35 AM on 08/19/2019 -----------------------------------------  Labs showing mild anemia which is new when compared to prior.  Rectal exam is negative.  Patient is not on blood thinners.  She is hemodynamically stable with no active signs of bleeding.  CT negative for acute injury.  Remainder of her labs shows no significant abnormalities.  X-ray of bilateral hips negative for new fracture or dislocation.  UA positive for UTI.  No signs of sepsis with no hyper or hypothermia, tachycardia, fever,  leukocytosis.  Patient started on Keflex.  At this time she is stable for discharge back to her skilled nursing facility.    Please note:  Patient was evaluated in Emergency Department today for the symptoms described in the history of present illness. Patient was evaluated in the context of the global COVID-19 pandemic, which necessitated consideration that the patient might be at risk for infection with the SARS-CoV-2 virus that causes COVID-19. Institutional protocols and algorithms that pertain to the evaluation of patients at risk for COVID-19 are in a state of rapid change based on information released by regulatory bodies including the CDC and federal and state organizations. These policies and algorithms were followed during the patient's care in the ED.  Some ED evaluations and interventions may be delayed as a result of limited staffing during the pandemic.   As part of my medical decision making, I reviewed the following data within the electronic MEDICAL RECORD NUMBER Nursing notes reviewed and incorporated, Labs reviewed , EKG interpreted , Old chart reviewed, Radiograph reviewed , Notes from prior ED visits and [place] Controlled Substance Database   ____________________________________________   FINAL CLINICAL IMPRESSION(S) / ED DIAGNOSES   Final diagnoses:    Fall, initial encounter  Acute cystitis with hematuria      NEW MEDICATIONS STARTED DURING THIS VISIT:  ED Discharge Orders         Ordered    cephALEXin (KEFLEX) 500 MG capsule  3 times daily     08/19/19 1610           Note:  This document was prepared using Dragon voice recognition software and may include unintentional dictation errors.    Don Perking, [place], MD 08/19/19 [phone]

## 2019-08-21 LAB — URINE CULTURE: Culture: >=100000 — AB

## 2019-09-05 ENCOUNTER — Encounter: Payer: Self-pay | Admitting: Emergency Medicine

## 2019-09-05 ENCOUNTER — Emergency Department: Payer: Medicare Other

## 2019-09-05 ENCOUNTER — Observation Stay
Admission: EM | Admit: 2019-09-05 | Discharge: 2019-09-06 | Disposition: A | Discharge status: Skilled Nursing Facility | Payer: Medicare Other | Attending: Internal Medicine | Admitting: Internal Medicine

## 2019-09-05 ENCOUNTER — Other Ambulatory Visit: Payer: Self-pay

## 2019-09-05 DIAGNOSIS — N390 Urinary tract infection, site not specified: Secondary | ICD-10-CM

## 2019-09-05 DIAGNOSIS — E039 Hypothyroidism, unspecified: Secondary | ICD-10-CM | POA: Diagnosis not present

## 2019-09-05 DIAGNOSIS — I509 Heart failure, unspecified: Secondary | ICD-10-CM | POA: Insufficient documentation

## 2019-09-05 DIAGNOSIS — Z20822 Contact with and (suspected) exposure to covid-19: Secondary | ICD-10-CM | POA: Diagnosis not present

## 2019-09-05 DIAGNOSIS — R531 Weakness: Secondary | ICD-10-CM | POA: Diagnosis not present

## 2019-09-05 DIAGNOSIS — R1110 Vomiting, unspecified: Secondary | ICD-10-CM | POA: Insufficient documentation

## 2019-09-05 DIAGNOSIS — Z7982 Long term (current) use of aspirin: Secondary | ICD-10-CM | POA: Insufficient documentation

## 2019-09-05 DIAGNOSIS — F329 Major depressive disorder, single episode, unspecified: Secondary | ICD-10-CM | POA: Insufficient documentation

## 2019-09-05 DIAGNOSIS — F0390 Unspecified dementia without behavioral disturbance: Secondary | ICD-10-CM | POA: Diagnosis not present

## 2019-09-05 DIAGNOSIS — Z79899 Other long term (current) drug therapy: Secondary | ICD-10-CM | POA: Insufficient documentation

## 2019-09-05 DIAGNOSIS — F419 Anxiety disorder, unspecified: Secondary | ICD-10-CM | POA: Diagnosis not present

## 2019-09-05 DIAGNOSIS — G9341 Metabolic encephalopathy: Secondary | ICD-10-CM | POA: Insufficient documentation

## 2019-09-05 DIAGNOSIS — U071 COVID-19: Secondary | ICD-10-CM | POA: Diagnosis not present

## 2019-09-05 DIAGNOSIS — Z515 Encounter for palliative care: Secondary | ICD-10-CM

## 2019-09-05 DIAGNOSIS — I110 Hypertensive heart disease with heart failure: Secondary | ICD-10-CM | POA: Diagnosis not present

## 2019-09-05 DIAGNOSIS — R8271 Bacteriuria: Secondary | ICD-10-CM | POA: Insufficient documentation

## 2019-09-05 DIAGNOSIS — H409 Unspecified glaucoma: Secondary | ICD-10-CM | POA: Diagnosis not present

## 2019-09-05 DIAGNOSIS — I2510 Atherosclerotic heart disease of native coronary artery without angina pectoris: Secondary | ICD-10-CM | POA: Diagnosis not present

## 2019-09-05 DIAGNOSIS — E785 Hyperlipidemia, unspecified: Secondary | ICD-10-CM | POA: Insufficient documentation

## 2019-09-05 LAB — CBC WITH DIFFERENTIAL/PLATELET
Abs Immature Granulocytes: 0.07 10*3/uL (ref 0.00–0.07)
Basophils Absolute: 0 10*3/uL (ref 0.0–0.1)
Basophils Relative: 1 %
Eosinophils Absolute: 0.1 10*3/uL (ref 0.0–0.5)
Eosinophils Relative: 1 %
HCT: 32.8 % — ABNORMAL LOW (ref 36.0–46.0)
Hemoglobin: 10.7 g/dL — ABNORMAL LOW (ref 12.0–15.0)
Immature Granulocytes: 1 %
Lymphocytes Relative: 21 %
Lymphs Abs: 1.4 10*3/uL (ref 0.7–4.0)
MCH: 33.9 pg (ref 26.0–34.0)
MCHC: 32.6 g/dL (ref 30.0–36.0)
MCV: 103.8 fL — ABNORMAL HIGH (ref 80.0–100.0)
Monocytes Absolute: 0.5 10*3/uL (ref 0.1–1.0)
Monocytes Relative: 7 %
Neutro Abs: 4.6 10*3/uL (ref 1.7–7.7)
Neutrophils Relative %: 69 %
Platelets: 200 10*3/uL (ref 150–400)
RBC: 3.16 MIL/uL — ABNORMAL LOW (ref 3.87–5.11)
RDW: 14.6 % (ref 11.5–15.5)
WBC: 6.6 10*3/uL (ref 4.0–10.5)
nRBC: 0 % (ref 0.0–0.2)

## 2019-09-05 LAB — COMPREHENSIVE METABOLIC PANEL
ALT: 11 U/L (ref 0–44)
AST: 24 U/L (ref 15–41)
Albumin: 2.9 g/dL — ABNORMAL LOW (ref 3.5–5.0)
Alkaline Phosphatase: 49 U/L (ref 38–126)
Anion gap: 9 (ref 5–15)
BUN: 18 mg/dL (ref 8–23)
CO2: 22 mmol/L (ref 22–32)
Calcium: 8.6 mg/dL — ABNORMAL LOW (ref 8.9–10.3)
Chloride: 107 mmol/L (ref 98–111)
Creatinine, Ser: 0.77 mg/dL (ref 0.44–1.00)
GFR calc Af Amer: >60 mL/min (ref >60)
GFR calc non Af Amer: >60 mL/min (ref >60)
Glucose, Bld: 140 mg/dL — ABNORMAL HIGH (ref 70–99)
Potassium: 3.5 mmol/L (ref 3.5–5.1)
Sodium: 138 mmol/L (ref 135–145)
Total Bilirubin: 0.9 mg/dL (ref 0.3–1.2)
Total Protein: 6.1 g/dL — ABNORMAL LOW (ref 6.5–8.1)

## 2019-09-05 LAB — FIBRIN DERIVATIVES D-DIMER (ARMC ONLY): Fibrin derivatives D-dimer (ARMC): 1160.47 ng/mL (FEU) — ABNORMAL HIGH (ref 0.00–499.00)

## 2019-09-05 LAB — URINALYSIS, COMPLETE (UACMP) WITH MICROSCOPIC
Bilirubin Urine: NEGATIVE
Glucose, UA: NEGATIVE mg/dL
Ketones, ur: 5 mg/dL — AB
Leukocytes,Ua: NEGATIVE
Nitrite: NEGATIVE
Protein, ur: 100 mg/dL — AB
Specific Gravity, Urine: 1.03 (ref 1.005–1.030)
WBC, UA: >50 WBC/hpf — ABNORMAL HIGH (ref 0–5)
pH: 5 (ref 5.0–8.0)

## 2019-09-05 LAB — C-REACTIVE PROTEIN: CRP: 0.9 mg/dL (ref <1.0)

## 2019-09-05 LAB — FIBRINOGEN: Fibrinogen: 248 mg/dL (ref 210–475)

## 2019-09-05 LAB — LIPASE, BLOOD: Lipase: 32 U/L (ref 11–51)

## 2019-09-05 LAB — TSH: TSH: 36.56 u[IU]/mL — ABNORMAL HIGH (ref 0.350–4.500)

## 2019-09-05 LAB — RESPIRATORY PANEL BY RT PCR (FLU A&B, COVID)
Influenza A by PCR: NEGATIVE
Influenza B by PCR: NEGATIVE
SARS Coronavirus 2 by RT PCR: POSITIVE — AB

## 2019-09-05 LAB — ABO/RH: ABO/RH(D): A POS

## 2019-09-05 LAB — FERRITIN: Ferritin: 449 ng/mL — ABNORMAL HIGH (ref 11–307)

## 2019-09-05 MED ORDER — ZINC SULFATE 220 (50 ZN) MG PO CAPS: 220.0000 mg | ORAL_CAPSULE | Freq: Every day | ORAL | Status: DC

## 2019-09-05 MED ORDER — DIPHENHYDRAMINE HCL 25 MG PO TABS: 25.0000 mg | ORAL_TABLET | Freq: Four times a day (QID) | ORAL | Status: DC | PRN

## 2019-09-05 MED ORDER — MAGNESIUM HYDROXIDE 400 MG/5ML PO SUSP
30.0000 mL | Freq: Every day | ORAL | Status: DC | PRN
  Filled 2019-09-05: qty 30

## 2019-09-05 MED ORDER — POLYVINYL ALCOHOL 1.4 % OP SOLN
1.0000 [drp] | Freq: Two times a day (BID) | OPHTHALMIC | Status: DC | PRN
  Filled 2019-09-05: qty 15

## 2019-09-05 MED ORDER — LEVOTHYROXINE SODIUM 50 MCG PO TABS: 100.0000 ug | ORAL_TABLET | Freq: Every day | ORAL | Status: DC

## 2019-09-05 MED ORDER — POLYVINYL ALCOHOL 1.4 % OP SOLN
1.0000 [drp] | Freq: Four times a day (QID) | OPHTHALMIC | Status: DC | PRN
  Filled 2019-09-05: qty 15

## 2019-09-05 MED ORDER — SENNOSIDES-DOCUSATE SODIUM 8.6-50 MG PO TABS: 1.0000 | ORAL_TABLET | Freq: Two times a day (BID) | ORAL | Status: DC

## 2019-09-05 MED ORDER — ASCORBIC ACID 500 MG PO TABS: 500.0000 mg | ORAL_TABLET | Freq: Every day | ORAL | Status: DC

## 2019-09-05 MED ORDER — GLYCOPYRROLATE 0.2 MG/ML IJ SOLN: 0.2000 mg | INTRAMUSCULAR | Status: DC | PRN

## 2019-09-05 MED ORDER — AMLODIPINE BESYLATE 5 MG PO TABS: 5.0000 mg | ORAL_TABLET | Freq: Every day | ORAL | Status: DC

## 2019-09-05 MED ORDER — CHOLECALCIFEROL 25 MCG (1000 UT) PO TABS: 2000.0000 [IU] | ORAL_TABLET | Freq: Every day | ORAL | Status: DC

## 2019-09-05 MED ORDER — BRIMONIDINE TARTRATE 0.2 % OP SOLN
1.0000 [drp] | Freq: Two times a day (BID) | OPHTHALMIC | Status: DC
  Administered 2019-09-05: 21:00:00 1 [drp] via OPHTHALMIC
  Filled 2019-09-05: qty 5

## 2019-09-05 MED ORDER — ONDANSETRON HCL 4 MG/2ML IJ SOLN
4.0000 mg | Freq: Once | INTRAMUSCULAR | Status: AC
  Administered 2019-09-05: 15:00:00 4 mg via INTRAVENOUS
  Filled 2019-09-05: qty 2

## 2019-09-05 MED ORDER — POTASSIUM CHLORIDE CRYS ER 20 MEQ PO TBCR: 10.0000 meq | EXTENDED_RELEASE_TABLET | Freq: Every day | ORAL | Status: DC

## 2019-09-05 MED ORDER — ALUM & MAG HYDROXIDE-SIMETH 200-200-20 MG/5ML PO SUSP: 30.0000 mL | Freq: Four times a day (QID) | ORAL | Status: DC | PRN

## 2019-09-05 MED ORDER — LATANOPROST 0.005 % OP SOLN
1.0000 [drp] | Freq: Every day | OPHTHALMIC | Status: DC
  Filled 2019-09-05 (×2): qty 2.5

## 2019-09-05 MED ORDER — ENOXAPARIN SODIUM 40 MG/0.4ML ~~LOC~~ SOLN: 40.0000 mg | SUBCUTANEOUS | Status: DC

## 2019-09-05 MED ORDER — AQUAPHOR EX OINT
1.0000 "application " | TOPICAL_OINTMENT | CUTANEOUS | Status: DC | PRN
  Filled 2019-09-05: qty 50

## 2019-09-05 MED ORDER — MELATONIN 5 MG PO TABS
5.0000 mg | ORAL_TABLET | Freq: Every day | ORAL | Status: DC
  Filled 2019-09-05: qty 1

## 2019-09-05 MED ORDER — ASPIRIN 81 MG PO CHEW: 81.0000 mg | CHEWABLE_TABLET | Freq: Every day | ORAL | Status: DC

## 2019-09-05 MED ORDER — METOPROLOL SUCCINATE ER 50 MG PO TB24: 100.0000 mg | ORAL_TABLET | Freq: Every day | ORAL | Status: DC

## 2019-09-05 MED ORDER — DEXTROSE 5 % IV SOLN: INTRAVENOUS | Status: DC

## 2019-09-05 MED ORDER — POLYETHYLENE GLYCOL 3350 17 G PO PACK: 17.0000 g | PACK | ORAL | Status: DC

## 2019-09-05 MED ORDER — CITALOPRAM HYDROBROMIDE 20 MG PO TABS: 10.0000 mg | ORAL_TABLET | Freq: Every day | ORAL | Status: DC

## 2019-09-05 MED ORDER — GUAIFENESIN 100 MG/5ML PO SOLN: 200.0000 mg | Freq: Four times a day (QID) | ORAL | Status: DC | PRN

## 2019-09-05 MED ORDER — TRAZODONE HCL 50 MG PO TABS: 50.0000 mg | ORAL_TABLET | Freq: Every day | ORAL | Status: DC

## 2019-09-05 MED ORDER — GLYCOPYRROLATE 1 MG PO TABS
1.0000 mg | ORAL_TABLET | ORAL | Status: DC | PRN
  Filled 2019-09-05: qty 1

## 2019-09-05 MED ORDER — PYRITHIONE ZINC 1 % EX SHAM
1.0000 "application " | MEDICATED_SHAMPOO | Freq: Every day | CUTANEOUS | Status: DC | PRN
  Filled 2019-09-05: qty 118

## 2019-09-05 MED ORDER — SODIUM CHLORIDE 0.9 % IV SOLN: Freq: Once | INTRAVENOUS | Status: AC

## 2019-09-05 MED ORDER — FUROSEMIDE 40 MG PO TABS: 20.0000 mg | ORAL_TABLET | Freq: Every day | ORAL | Status: DC

## 2019-09-05 MED ORDER — SODIUM CHLORIDE 0.9 % IV BOLUS
500.0000 mL | Freq: Once | INTRAVENOUS | Status: AC
  Administered 2019-09-05: 500 mL via INTRAVENOUS

## 2019-09-05 MED ORDER — MORPHINE SULFATE (PF) 2 MG/ML IV SOLN
1.0000 mg | INTRAVENOUS | Status: DC | PRN
  Administered 2019-09-05: 21:00:00 1 mg via INTRAVENOUS
  Filled 2019-09-05: qty 1

## 2019-09-05 MED ORDER — FOSFOMYCIN TROMETHAMINE 3 G PO PACK: 3.0000 g | PACK | Freq: Once | ORAL | Status: DC

## 2019-09-05 NOTE — ED Provider Notes (Addendum)
[facility] Emergency Department Provider Note  Time seen: 1:49 PM  I have reviewed the triage vital signs and the nursing notes.   HISTORY  Chief Complaint Emesis   HPI Gloria Martinez is a 86 y.o. female with a past medical history of anxiety, CHF, dementia, hypertension, hyperlipidemia presents to the emergency department for fatigue.   According to EMS patient is from the [place] [facility] [place] home, states at baseline the patient is more alert and talkative.  Today the patient has been more fatigued and somnolent, they also report one episode of vomiting earlier today.  Currently the patient appears well, no distress, she is sleeping but awakens to voice, will briefly answer questions although inaccurate answers and then falls back asleep.  Does not appear to be in any distress or discomfort at this time.  Past Medical History:  Diagnosis Date  . Anxiety   . CHF (congestive heart failure) ([place])   . Coronary artery disease   . Dementia ([place])   . Diverticulitis   . Dysphagia   . Epilepsy ([place])   . Glaucoma   . Hyperlipidemia   . Hypertension   . Hypothyroidism   . Muscle weakness     Patient Active Problem List   Diagnosis Date Noted  . Pelvic fracture ([place]) 03/24/2019  . Hypothyroidism 03/24/2019  . HTN (hypertension) 03/24/2019  . HLD (hyperlipidemia) 03/24/2019  . CAD (coronary artery disease) 03/24/2019  . Hip fracture ([place]) 08/09/2013    Past Surgical History:  Procedure Laterality Date  . ABDOMINAL HYSTERECTOMY    . Hip Replacement  Right 2009    Prior to Admission medications   Medication Sig Start Date End Date Taking? Authorizing Provider  acetaminophen (TYLENOL) 325 MG tablet Take 650 mg by mouth every 6 (six) hours as needed for mild pain or fever.     [provider]  alum & mag hydroxide-simeth (MAALOX/MYLANTA) 200-200-20 MG/5ML suspension Take 30 mLs by mouth every 6 (six) hours as needed for indigestion or heartburn.     [provider]  amLODipine (NORVASC) 5 MG tablet Take 5 mg by mouth daily.    [provider]  aspirin 81 MG chewable tablet Chew 81 mg by mouth daily.    [provider]  barrier cream (NON-SPECIFIED) CREA Apply 1 application topically as needed (prevention of skin breakdown).    [provider]  brimonidine (ALPHAGAN) 0.2 % ophthalmic solution Place 1 drop into both eyes 2 (two) times daily.    [provider]  Cholecalciferol (D3-1000) 1000 units tablet Take 2,000 Units by mouth daily.     [provider]  citalopram (CELEXA) 10 MG tablet Take 10 mg by mouth daily.    [provider]  diphenhydrAMINE (BENADRYL) 25 MG tablet Take 25 mg by mouth every 6 (six) hours as needed for allergies.    [provider]  furosemide (LASIX) 20 MG tablet Take 20 mg by mouth daily.    [provider]  guaiFENesin (ROBITUSSIN) 100 MG/5ML liquid Take 200 mg by mouth 4 (four) times daily as needed for cough.    [provider]  hydrocortisone cream 1 % Apply 1 application topically daily as needed for itching.    [provider]  ipratropium (ATROVENT) 0.03 % nasal spray Place 2 sprays into both nostrils 3 (three) times daily as needed for congestion. 09/01/18   [provider]  latanoprost (XALATAN) 0.005 % ophthalmic solution Place 1 drop into the right  eye at bedtime.     [provider]  levothyroxine (SYNTHROID, LEVOTHROID) 100 MCG tablet Take 100 mcg by mouth daily before breakfast.     [provider]  loperamide (IMODIUM A-D) 2 MG tablet Take 4 mg by mouth 4 (four) times daily as needed for diarrhea or loose stools.    [provider]  magnesium hydroxide (MILK OF MAGNESIA) 400 MG/5ML suspension Take 30 mLs by mouth daily as needed for mild constipation.    [provider]  Melatonin 3 MG TABS Take 3 mg by mouth at bedtime.    [provider]  Menthol-Zinc  Oxide (GOLD BOND EX) Apply 1 application topically daily as needed (itching). May use powder or lotion    [provider]  metoprolol succinate (TOPROL-XL) 100 MG 24 hr tablet Take 100 mg by mouth daily. Take with or immediately following a meal.    [provider]  neomycin-bacitracin-polymyxin (NEOSPORIN) ointment Apply 1 application topically as needed (skin abrasions/ minor skin tears).    [provider]  Polyethyl Glycol-Propyl Glycol (LUBRICANT EYE DROPS) 0.4-0.3 % SOLN Place 1 drop into both eyes 2 (two) times daily.    [provider]  polyethylene glycol (MIRALAX / GLYCOLAX) packet Take 17 g by mouth every other day.    [provider]  potassium chloride (K-DUR) 10 MEQ tablet Take 10 mEq by mouth daily.    [provider]  pyrithione zinc (HEAD AND SHOULDERS) 1 % shampoo Apply 1 application topically daily as needed (scalp irritation/ dandruff).    [provider]  senna-docusate (SENOKOT-S) 8.6-50 MG tablet Take 1 tablet by mouth 2 (two) times daily.    [provider]  traZODone (DESYREL) 50 MG tablet Take 50 mg by mouth at bedtime.     [provider]    Allergies  Allergen Reactions  . Ciprofloxacin   . Macrobid [employer] Macro]   . Penicillins   . Percocet [Oxycodone-Acetaminophen]   . Sulfa Antibiotics   . Tussionex Pennkinetic Er [Hydrocod Polst-Cpm Polst Er]   . Vicodin [Hydrocodone-Acetaminophen]     History reviewed. No pertinent family history.  Social History Social History   Tobacco Use  . Smoking status: Never Smoker  . Smokeless tobacco: Never Used  Substance Use Topics  . Alcohol use: No  . Drug use: No    Review of Systems Unable to obtain an adequate/accurate review of system secondary to baseline dementia, possible altered mental status. ____________________________________________   PHYSICAL EXAM:  VITAL SIGNS: ED Triage Vitals  Enc Vitals Group      BP 09/05/19 1257 (!) 110/58     Pulse Rate 09/05/19 1257 64     Resp 09/05/19 1257 18     Temp 09/05/19 1257 (!) 97.3 F (36.3 C)     Temp Source 09/05/19 1257 Axillary     SpO2 09/05/19 1257 97 %     Weight 09/05/19 1254 119 lb 0.8 oz (54 kg)     Height 09/05/19 1254 5\' 3"  (1.6 m)     Head Circumference --      Peak Flow --      Pain Score --      Pain Loc --      Pain Edu? --      Excl. in GC? --    Constitutional: Somnolent but awakens to voice will briefly answer questions although an accurate and falls back asleep. Eyes: Normal exam ENT      Head: Normocephalic  and atraumatic.      Mouth/Throat: Mucous membranes are moist. Cardiovascular: Normal rate, regular rhythm. Respiratory: Normal respiratory effort without tachypnea nor retractions. Breath sounds are clear  Gastrointestinal: Soft and nontender. No distention.  No reaction to abdominal palpation. Musculoskeletal: Nontender with normal range of motion in all extremities. Neurologic:  Normal speech and language.  Able to move all extremities. Skin:  Skin is warm, dry and intact.  Psychiatric: Mood and affect are normal.   ____________________________________________    EKG  EKG viewed and interpreted by myself shows a sinus rhythm versus junctional rhythm at 63 bpm with a narrow QRS, normal axis, normal intervals, no concerning ST changes.  ____________________________________________    RADIOLOGY  CT scan of the head is negative for acute abnormality.  ____________________________________________   INITIAL IMPRESSION / ASSESSMENT AND PLAN / ED COURSE  Pertinent labs & imaging results that were available during my care of the patient were reviewed by me and considered in my medical decision making (see chart for details).   Patient presents to the emergency department for fatigue/weakness noted today by staff.  Currently the patient does appear somnolent but does not appear to be in any pain discomfort or  distress.  Nontender abdomen.  We will check labs, IV hydrate treat with Zofran and continue to closely monitor.  We will also obtain a CT scan of the head as a precaution and a Covid swab.  Patient's urinalysis is consistent with urinary tract infection.  Patient has significant list of antibiotic allergies.  We will dose fosfomycin.  Patient's Covid test has resulted positive this is much more likely because of the patient's somnolence and altered mental state.  We will admit to the hospital service for continued treatment.  Updated son.  DRUANNE BOSQUES was evaluated in Emergency Department on 09/05/2019 for the symptoms described in the history of present illness. She was evaluated in the context of the global COVID-19 pandemic, which necessitated consideration that the patient might be at risk for infection with the SARS-CoV-2 virus that causes COVID-19. Institutional protocols and algorithms that pertain to the evaluation of patients at risk for COVID-19 are in a state of rapid change based on information released by regulatory bodies including the CDC and federal and state organizations. These policies and algorithms were followed during the patient's care in the ED.  ____________________________________________   FINAL CLINICAL IMPRESSION(S) / ED DIAGNOSES  Weakness COVID-19 Urinary tract infection   Minna Antis, MD 09/05/19 1558    Minna Antis, MD 09/05/19 [phone]

## 2019-09-05 NOTE — H&P (Signed)
[place] at [facility]   PATIENT NAME: Gloria Martinez    MR#:  [id number]  DATE OF BIRTH:  10-Dec-1919  DATE OF ADMISSION:  09/05/2019  PRIMARY CARE PHYSICIAN: Lauro Regulus, MD   REQUESTING/REFERRING PHYSICIAN: Minna Antis, MD  CHIEF COMPLAINT:   Chief Complaint  Patient presents with  . Emesis    HISTORY OF PRESENT ILLNESS:  Gloria Martinez  is a 86 y.o. female with a known history of anxiety, CHF, dementia, hypertension, hyperlipidemia is being admitted for acute metabolic encephalopathy likely due to COVID-19 and/or UTI. According to EMS patient is from the [place] [facility] [place] nursing home, states at baseline the patient is more alert and talkative.  Today the patient has been more fatigued and somnolent, they also report one episode of vomiting earlier today.  Currently the patient appears well, no distress, she is sleeping but awakens to voice, will briefly answer questions although inaccurate answers and then falls back asleep.  Does not appear to be in any distress or discomfort at this time. PAST MEDICAL HISTORY:   Past Medical History:  Diagnosis Date  . Anxiety   . CHF (congestive heart failure) ([facility])   . Coronary artery disease   . Dementia ([facility])   . Diverticulitis   . Dysphagia   . Epilepsy ([facility])   . Glaucoma   . Hyperlipidemia   . Hypertension   . Hypothyroidism   . Muscle weakness     PAST SURGICAL HISTORY:   Past Surgical History:  Procedure Laterality Date  . ABDOMINAL HYSTERECTOMY    . Hip Replacement  Right 2009    SOCIAL HISTORY:   Social History   Tobacco Use  . Smoking status: Never Smoker  . Smokeless tobacco: Never Used  Substance Use Topics  . Alcohol use: No    FAMILY HISTORY:  History reviewed. No pertinent family history. Positive for hypertension and stroke DRUG ALLERGIES:   Allergies  Allergen Reactions  . Ciprofloxacin   . Macrobid [employer] Macro]   . Penicillins   . Percocet  [Oxycodone-Acetaminophen]   . Sulfa Antibiotics   . Tussionex Pennkinetic Er [Hydrocod Polst-Cpm Polst Er]   . Vicodin [Hydrocodone-Acetaminophen]     REVIEW OF SYSTEMS:   ROS unable to obtain due to her mental status  MEDICATIONS AT HOME:   Prior to Admission medications   Medication Sig Start Date End Date Taking? Authorizing Provider  acetaminophen (TYLENOL) 325 MG tablet Take 650 mg by mouth every 6 (six) hours as needed for mild pain or fever.     [provider]  alum & mag hydroxide-simeth (MAALOX/MYLANTA) 200-200-20 MG/5ML suspension Take 30 mLs by mouth every 6 (six) hours as needed for indigestion or heartburn.    [provider]  amLODipine (NORVASC) 5 MG tablet Take 5 mg by mouth daily.    [provider]  aspirin 81 MG chewable tablet Chew 81 mg by mouth daily.    [provider]  barrier cream (NON-SPECIFIED) CREA Apply 1 application topically as needed (prevention of skin breakdown).    [provider]  brimonidine (ALPHAGAN) 0.2 % ophthalmic solution Place 1 drop into both eyes 2 (two) times daily.    [provider]  Cholecalciferol (D3-1000) 1000 units tablet Take 2,000 Units by mouth daily.     [provider]  citalopram (CELEXA) 10 MG tablet Take 10 mg by mouth daily.    [provider]  diphenhydrAMINE (BENADRYL) 25 MG tablet Take 25 mg by  mouth every 6 (six) hours as needed for allergies.    [provider]  furosemide (LASIX) 20 MG tablet Take 20 mg by mouth daily.    [provider]  guaiFENesin (ROBITUSSIN) 100 MG/5ML liquid Take 200 mg by mouth 4 (four) times daily as needed for cough.    [provider]  hydrocortisone cream 1 % Apply 1 application topically daily as needed for itching.    [provider]  ipratropium (ATROVENT) 0.03 % nasal spray Place 2 sprays into both nostrils 3 (three) times daily as needed for congestion. 09/01/18   [provider]  latanoprost (XALATAN) 0.005 % ophthalmic solution Place 1 drop into the right eye at bedtime.     [provider]  levothyroxine (SYNTHROID, LEVOTHROID) 100 MCG tablet Take 100 mcg by mouth daily before breakfast.     [provider]  loperamide (IMODIUM A-D) 2 MG tablet Take 4 mg by mouth 4 (four) times daily as needed for diarrhea or loose stools.    [provider]  magnesium hydroxide (MILK OF MAGNESIA) 400 MG/5ML suspension Take 30 mLs by mouth daily as needed for mild constipation.    [provider]  Melatonin 3 MG TABS Take 3 mg by mouth at bedtime.    [provider]  Menthol-Zinc Oxide (GOLD BOND EX) Apply 1 application topically daily as needed (itching). May use powder or lotion    [provider]  metoprolol succinate (TOPROL-XL) 100 MG 24 hr tablet Take 100 mg by mouth daily. Take with or immediately following a meal.    [provider]  neomycin-bacitracin-polymyxin (NEOSPORIN) ointment Apply 1 application topically as needed (skin abrasions/ minor skin tears).    [provider]  Polyethyl Glycol-Propyl Glycol (LUBRICANT EYE DROPS) 0.4-0.3 % SOLN Place 1 drop into both eyes 2 (two) times daily.    [provider]  polyethylene glycol (MIRALAX / GLYCOLAX) packet Take 17 g by mouth every other day.    [provider]  potassium chloride (K-DUR) 10 MEQ tablet Take 10 mEq by mouth daily.    [provider]  pyrithione zinc (HEAD AND SHOULDERS) 1 % shampoo Apply 1 application topically daily as needed (scalp irritation/ dandruff).    [provider]  senna-docusate (SENOKOT-S) 8.6-50 MG tablet Take 1 tablet by mouth 2 (two) times daily.    [provider]  traZODone (DESYREL) 50 MG tablet Take 50 mg by mouth at bedtime.     [provider]      VITAL SIGNS:  Blood pressure (!) 144/91, pulse (!) 51, temperature (!) 97.3 F (36.3 C), temperature  source Axillary, resp. rate 13, height 5\' 3"  (1.6 m), weight 54 kg, SpO2 99 %.  PHYSICAL EXAMINATION:  Physical Exam  GENERAL:  86 y.o.-year-old patient lying in the bed with no acute distress.  Constitutional: Somnolent but awakens to voice will briefly answer questions although an accurate and falls back asleep. Eyes: Normal exam ENT   Head: Normocephalic and atraumatic.   Mouth/Throat: Mucous membranes are moist. Cardiovascular: Normal rate, regular rhythm. Respiratory: Normal respiratory effort without tachypnea nor retractions. Breath sounds are clear  Gastrointestinal: Soft and nontender. No distention.  No reaction to abdominal palpation. Musculoskeletal: Nontender with normal range of motion in all extremities. Neurologic:  Normal speech and language.  Able to move all extremities. Skin:  Skin is warm, dry and intact.  Psychiatric: Mood and affect are normal.  LABORATORY PANEL:   CBC Recent Labs  Lab 09/05/19 1301  WBC 6.6  HGB 10.7*  HCT 32.8*  PLT 200   ------------------------------------------------------------------------------------------------------------------  Chemistries  Recent Labs  Lab 09/05/19 1301  NA 138  K 3.5  CL 107  CO2 22  GLUCOSE 140*  BUN 18  CREATININE 0.77  CALCIUM 8.6*  AST 24  ALT 11  ALKPHOS 49  BILITOT 0.9   ------------------------------------------------------------------------------------------------------------------  Cardiac Enzymes No results for input(s): TROPONINI in the last 168 hours. ------------------------------------------------------------------------------------------------------------------  RADIOLOGY:  CT Head Wo Contrast  Result Date: 09/05/2019 CLINICAL DATA:  Decreased mental status EXAM: CT HEAD WITHOUT CONTRAST TECHNIQUE: Contiguous axial images were obtained from the base of the skull through the vertex without intravenous contrast. COMPARISON:  08/19/2019 FINDINGS: Brain: Chronic atrophic and  ischemic changes are noted. No findings to suggest acute hemorrhage, acute infarction or space-occupying mass lesion are noted. Vascular: No hyperdense vessel or unexpected calcification. Skull: Normal. Negative for fracture or focal lesion. Sinuses/Orbits: No acute finding. Other: None. IMPRESSION: Chronic atrophic and ischemic changes similar to that seen on the prior exam. Electronically Signed   By: Inez Catalina M.D.   On: 09/05/2019 14:27   IMPRESSION AND PLAN:  86 year old female with past medical history of dementia, coronary artery disease, glaucoma, hypertension, hyperlipidemia, hypothyroidism, history of CHF, anxiety  being admitted for acute metabolic encephalopathy  * Acute metabolic encephalopathy -Multifactorial from Covid/UTI  Covid infection: As patient is on room air, we will not start any medication at this time Asymptomatic bacteriuria: Based on UA, doubt this is true infection we will hold off asymptomatic bacteriuria as she does not have any fever or leukocytosis  Essential hypertension Hypothyroidism - continue Synthroid, check TSH History of glaucoma-continue Alphagan, Xalantan.  Depression -patient will cont. Celexa.  Chronic diastolic CHF  Overall very poor prognosis  Had a long discussion with patient's son who is her healthcare power of attorney -he requests to keep her comfortable and screen her for hospice to see if she would be able to go back with hospice.  He does not want any aggressive treatment.  Will consult palliative care    All the records are reviewed and case discussed with ED provider. Management plans discussed with the patient, family (son over phone who lives in [place]) and they are in agreement.  CODE STATUS: DNR  TOTAL TIME TAKING CARE OF THIS PATIENT: 45 minutes.    Max Sane M.D on 09/05/2019 at 5:18 PM  [facility]   CC: Primary care physician; Kirk Ruths, MD   Note: This dictation was prepared with Dragon  dictation along with smaller phrase technology. Any transcriptional errors that result from this process are unintentional.

## 2019-09-05 NOTE — ED Triage Notes (Addendum)
Pt via EMS from [place] [facility] [address] Per staff, pt has not been her self alert and talking with staff. Pt also vomited today after lunch. Pt is on a pureed diet and nectar thick liquids. On arrival, pt is alert and oriented to self only. Pt is moaning on arrival.

## 2019-09-06 DIAGNOSIS — F0280 Dementia in other diseases classified elsewhere without behavioral disturbance: Secondary | ICD-10-CM

## 2019-09-06 DIAGNOSIS — N390 Urinary tract infection, site not specified: Secondary | ICD-10-CM

## 2019-09-06 DIAGNOSIS — R531 Weakness: Secondary | ICD-10-CM

## 2019-09-06 DIAGNOSIS — G301 Alzheimer's disease with late onset: Secondary | ICD-10-CM

## 2019-09-06 DIAGNOSIS — Z515 Encounter for palliative care: Secondary | ICD-10-CM

## 2019-09-06 DIAGNOSIS — U071 COVID-19: Secondary | ICD-10-CM | POA: Diagnosis not present

## 2019-09-06 DIAGNOSIS — F0390 Unspecified dementia without behavioral disturbance: Secondary | ICD-10-CM

## 2019-09-06 LAB — MRSA PCR SCREENING: MRSA by PCR: POSITIVE — AB

## 2019-09-06 MED ORDER — LORAZEPAM 0.5 MG PO TABS: 0.5000 mg | ORAL_TABLET | ORAL | 0 refills | Status: DC | PRN

## 2019-09-06 MED ORDER — DIPHENHYDRAMINE HCL 50 MG/ML IJ SOLN: 12.5000 mg | Freq: Four times a day (QID) | INTRAMUSCULAR | Status: DC | PRN

## 2019-09-06 MED ORDER — BISACODYL 10 MG RE SUPP: 10.0000 mg | Freq: Every day | RECTAL | Status: DC | PRN

## 2019-09-06 MED ORDER — LORAZEPAM 2 MG/ML IJ SOLN: 0.5000 mg | Freq: Four times a day (QID) | INTRAMUSCULAR | Status: DC | PRN

## 2019-09-06 MED ORDER — MORPHINE SULFATE 20 MG/5ML PO SOLN: 5.0000 mg | ORAL | 0 refills | Status: DC | PRN

## 2019-09-06 MED ORDER — MORPHINE SULFATE (CONCENTRATE) 10 MG/0.5ML PO SOLN: 5.0000 mg | ORAL | Status: DC | PRN

## 2019-09-06 NOTE — Plan of Care (Signed)
  Problem: Education: Goal: Knowledge of General Education information will improve Description: Including pain rating scale, medication(s)/side effects and non-pharmacologic comfort measures Outcome: Adequate for Discharge   Problem: Health Behavior/Discharge Planning: Goal: Ability to manage health-related needs will improve Outcome: Adequate for Discharge   Problem: Education: Goal: Knowledge of risk factors and measures for prevention of condition will improve Outcome: Adequate for Discharge   Problem: Coping: Goal: Psychosocial and spiritual needs will be supported Outcome: Adequate for Discharge   Problem: Respiratory: Goal: Will maintain a patent airway Outcome: Adequate for Discharge Goal: Complications related to the disease process, condition or treatment will be avoided or minimized Outcome: Adequate for Discharge   Problem: Education: Goal: Knowledge of the prescribed therapeutic regimen will improve Outcome: Adequate for Discharge   Problem: Coping: Goal: Ability to identify and develop effective coping behavior will improve Outcome: Adequate for Discharge   Problem: Clinical Measurements: Goal: Quality of life will improve Outcome: Adequate for Discharge   Problem: Respiratory: Goal: Verbalizations of increased ease of respirations will increase Outcome: Adequate for Discharge   Problem: Role Relationship: Goal: Family's ability to cope with current situation will improve Outcome: Adequate for Discharge Goal: Ability to verbalize concerns, feelings, and thoughts to partner or family member will improve Outcome: Adequate for Discharge   Problem: Pain Management: Goal: Satisfaction with pain management regimen will improve Outcome: Adequate for Discharge

## 2019-09-06 NOTE — Progress Notes (Signed)
Gloria Martinez, the nurse at [facility], called for an update. She took report from me and was informed that patient would be discharged today and followed by [facility]. Keisha veralized understanding and had no questions at this time.

## 2019-09-06 NOTE — TOC Transition Note (Signed)
Transition of Care [facility]) - CM/SW Discharge Note   Patient Details  Name: Gloria Martinez MRN: [id number] Date of Birth: 30-Sep-1919  Transition of Care [facility]) CM/SW Contact:  Allayne Butcher, RN Phone Number: 09/06/2019, 4:03 PM   Clinical Narrative:     Patient has a hospital bed in the facility- it was ordered a couple of weeks ago by her home health agency.  Hospice will change the bed out to their equipment but it does not need to be done tonight.  Patient's son updated that patient will return to [facility] with [facility] services tonight.  Dustin at [employer] is aware of patient discharge.  [facility] will provide transportation.   Final next level of care: Assisted Living(with hospice) Barriers to Discharge: Barriers Resolved   Patient Goals and CMS Choice Patient states their goals for this hospitalization and ongoing recovery are:: Son would like for the patient to be comfortable and return to [facility] under hospice. CMS Medicare.gov Compare Post Acute Care list provided to:: Patient Represenative (must comment)(son) Choice offered to / list presented to : [facility] POA / Guardian  Discharge Placement              Patient chooses bed at: (Back to [facility]) Patient to be transferred to facility by: [facility] Name of family member notified: Theola Sequin- son Patient and family notified of of transfer: 09/06/19  Discharge Plan and Services In-house Referral: Hospice / Palliative Care Discharge Planning Services: CM Consult Post Acute Care Choice: Hospice          DME Arranged: Hospital bed DME Agency: Other - Comment([facility]) Date DME Agency Contacted: 09/06/19 Time DME Agency Contacted: 1145 Representative spoke with at DME Agency: Dayna Barker            Social Determinants of Health (SDOH) Interventions     Readmission Risk Interventions No flowsheet data found.

## 2019-09-06 NOTE — Consult Note (Signed)
Consultation Note Date: 09/06/2019   Patient Name: Gloria Martinez  DOB: [birth year]  MRN: [id number]  Age / Sex: 86 y.o., female  PCP: Kirk Ruths, MD Referring Physician: Max Sane, MD  Reason for Consultation: Establishing goals of care  HPI/Patient Profile: 86 y.o. female  with past medical history of dementia, diastolic CHF, HTN, HLD, anxiety admitted on 09/05/2019 with acute metabolic encephalopathy. Found to be positive covid-19 and UTI. Patient on room air but remains somnolent. Attending discussed with son, who requests to keep her comfortable and screen for hospice. Palliative medicine consultation for EOL care/hospice referral.   Clinical Assessment and Goals of Care:  Chart reviewed and discussed with RN. Patient requiring minimal prn medications for comfort (only one dose of morphine last night). She is comfortable on room air. Few bites taken of breakfast. Baseline dementia with ongoing AMS.  Spoke with son Herbie Baltimore) and DIL Bethena Roys) via telephone to discuss plan of care. They live in [place] and have not seen Gloria Martinez since the beginning of the pandemic. She has lived at [facility] for many years. Herbie Baltimore is an only child. The patient has two living sisters.   Discussed events leading up to admission and course of hospitalization including diagnoses, interventions, plan of care. Discussed disease trajectory of dementia.   Herbie Baltimore shares his conversation with Dr. Manuella Ghazi yesterday, including "quality of life" being more important than "quantity" for his mother. He confirms the decision to focus on keeping her "comfortable" for her remaining days and interest in hospice services.   Discussed comfort focused care and focus on comfort, peace, dignity at EOL. Discussed medications as needed to ensure comfort and relief from suffering. Educated on hospice options and philosophy, including prevention of  re-hospitalization and allowing nature to take course. Herbie Baltimore and Bethena Roys are agreeable for her to return to [facility] if they are able to provide higher level of care (total care) and if they are willing to take her back being covid +. Second preference, would be [facility] hospice facility. Reassured family that [facility] SW/CM would assist with discharge plan.   Answered questions and concerns about plan of care.    SUMMARY OF RECOMMENDATIONS    Son/POA (only child) confirms decision for comfort measures only.  Continue prn medications on MAR for symptom management.  Continue comfort feeds per patient request. Frequent oral care.  TOC team consult to assist with hospice referral. Family requesting back to [facility] with hospice services if they can provider higher level of care (total care) for [place] and/or if they will accept her with Covid + test. If not, family prefers [facility] facility as second option.   Code Status/Advance Care Planning:  DNR  Symptom Management:   Roxanol 5mg  SL q2h prn pain/dyspnea/tachypnea/air hunger  Morphine 1mg  IV q4h prn severe pain/dyspnea/tachypnea  Ativan 0.5mg  IV q6h prn anxiety  Robinul 0.2mg  IV q4h prn secretions  Benadryl 12.5mg  IV q6h prn itching  Dulcolax suppository daily prn  Palliative Prophylaxis:   Aspiration, Delirium Protocol, Frequent Pain  Assessment, Oral Care and Turn Reposition  Additional Recommendations (Limitations, Scope, Preferences):  Full Comfort Care  Psycho-social/Spiritual:   Desire for further Chaplaincy support:yes  Additional Recommendations: Caregiving  Support/Resources, Compassionate Wean Education and Education on Hospice  Prognosis:   Likely weeks if not less with progressive dementia, adult failure to thrive, poor oral intake complicated by UTI and covid-19 infection.   Discharge Planning: Back to [facility] with hospice services if they will accept. Second preference per family, is [facility] hospice  facility.      Primary Diagnoses: Present on Admission: . COVID-19   I have reviewed the medical record, interviewed the patient and family, and examined the patient. The following aspects are pertinent.  Past Medical History:  Diagnosis Date  . Anxiety   . CHF (congestive heart failure) (HCC)   . Coronary artery disease   . Dementia (HCC)   . Diverticulitis   . Dysphagia   . Epilepsy (HCC)   . Glaucoma   . Hyperlipidemia   . Hypertension   . Hypothyroidism   . Muscle weakness    Social History   Socioeconomic History  . Marital status: Widowed    Spouse name: Not on file  . Number of children: Not on file  . Years of education: Not on file  . Highest education level: Not on file  Occupational History  . Not on file  Tobacco Use  . Smoking status: Never Smoker  . Smokeless tobacco: Never Used  Substance and Sexual Activity  . Alcohol use: No  . Drug use: No  . Sexual activity: Not on file  Other Topics Concern  . Not on file  Social History Narrative  . Not on file   Social Determinants of Health   Financial Resource Strain:   . Difficulty of Paying Living Expenses: Not on file  Food Insecurity:   . Worried About Programme researcher, broadcasting/film/video in the Last Year: Not on file  . Ran Out of Food in the Last Year: Not on file  Transportation Needs:   . Lack of Transportation (Medical): Not on file  . Lack of Transportation (Non-Medical): Not on file  Physical Activity:   . Days of Exercise per Week: Not on file  . Minutes of Exercise per Session: Not on file  Stress:   . Feeling of Stress : Not on file  Social Connections:   . Frequency of Communication with Friends and Family: Not on file  . Frequency of Social Gatherings with Friends and Family: Not on file  . Attends Religious Services: Not on file  . Active Member of Clubs or Organizations: Not on file  . Attends Banker Meetings: Not on file  . Marital Status: Not on file   History reviewed. No  pertinent family history. Scheduled Meds: . brimonidine  1 drop Both Eyes BID  . fosfomycin  3 g Oral Once  . latanoprost  1 drop Right Eye QHS  . zinc sulfate  220 mg Oral Daily   Continuous Infusions: PRN Meds:.alum & mag hydroxide-simeth, glycopyrrolate **OR** glycopyrrolate **OR** glycopyrrolate, guaiFENesin, magnesium hydroxide, mineral oil-hydrophilic petrolatum, morphine injection, polyvinyl alcohol, pyrithione zinc Medications Prior to Admission:  Prior to Admission medications   Medication Sig Start Date End Date Taking? Authorizing Provider  amLODipine (NORVASC) 5 MG tablet Take 5 mg by mouth daily.   Yes [provider]  aspirin 81 MG chewable tablet Chew 81 mg by mouth daily.   Yes [provider]  brimonidine [facility])  0.2 % ophthalmic solution Place 1 drop into both eyes 2 (two) times daily.   Yes [provider]  Cholecalciferol (D3-1000) 1000 units tablet Take 2,000 Units by mouth daily.    Yes [provider]  citalopram (CELEXA) 10 MG tablet Take 10 mg by mouth daily.   Yes [provider]  diphenhydrAMINE (BENADRYL) 25 MG tablet Take 25 mg by mouth every 6 (six) hours as needed for allergies.   Yes [provider]  furosemide (LASIX) 20 MG tablet Take 20 mg by mouth daily.   Yes [provider]  ipratropium (ATROVENT) 0.03 % nasal spray Place 2 sprays into both nostrils 3 (three) times daily as needed for congestion. 09/01/18  Yes [provider]  latanoprost (XALATAN) 0.005 % ophthalmic solution Place 1 drop into the right eye at bedtime.    Yes [provider]  levothyroxine (SYNTHROID, LEVOTHROID) 100 MCG tablet Take 100 mcg by mouth daily before breakfast.    Yes [provider]  Melatonin 3 MG TABS Take 3 mg by mouth at bedtime.   Yes [provider]  metoprolol succinate (TOPROL-XL) 100 MG 24 hr tablet Take 100 mg by mouth daily. Take with or immediately following a meal.    Yes [provider]  potassium chloride (K-DUR) 10 MEQ tablet Take 10 mEq by mouth daily.   Yes [provider]  pyrithione zinc (HEAD AND SHOULDERS) 1 % shampoo Apply 1 application topically daily as needed (scalp irritation/ dandruff).   Yes [provider]  senna-docusate (SENOKOT-S) 8.6-50 MG tablet Take 1 tablet by mouth 2 (two) times daily.   Yes [provider]  traZODone (DESYREL) 50 MG tablet Take 50 mg by mouth at bedtime.    Yes [provider]  acetaminophen (TYLENOL) 325 MG tablet Take 650 mg by mouth every 6 (six) hours as needed for mild pain or fever.     [provider]  alum & mag hydroxide-simeth (MAALOX/MYLANTA) 200-200-20 MG/5ML suspension Take 30 mLs by mouth every 6 (six) hours as needed for indigestion or heartburn.    [provider]  barrier cream (NON-SPECIFIED) CREA Apply 1 application topically as needed (prevention of skin breakdown).    [provider]  guaiFENesin (ROBITUSSIN) 100 MG/5ML liquid Take 200 mg by mouth 4 (four) times daily as needed for cough.    [provider]  hydrocortisone cream 1 % Apply 1 application topically daily as needed for itching.    [provider]  loperamide (IMODIUM A-D) 2 MG tablet Take 4 mg by mouth 4 (four) times daily as needed for diarrhea or loose stools.    [provider]  magnesium hydroxide (MILK OF MAGNESIA) 400 MG/5ML suspension Take 30 mLs by mouth daily as needed for mild constipation.    [provider]  Menthol-Zinc Oxide (GOLD BOND EX) Apply 1 application topically daily as needed (itching). May use powder or lotion    [provider]  neomycin-bacitracin-polymyxin (NEOSPORIN) ointment Apply 1 application topically as needed (skin abrasions/ minor skin tears).    [provider]  Polyethyl Glycol-Propyl Glycol (LUBRICANT EYE DROPS) 0.4-0.3 % SOLN Place 1 drop into both eyes 2 (two) times daily.     [provider]  polyethylene glycol (MIRALAX / GLYCOLAX) packet Take 17 g by mouth every other day.    [provider]   Allergies  Allergen Reactions  . Ciprofloxacin   . Macrobid [employer] Macro]   . Penicillins   .  Percocet [Oxycodone-Acetaminophen]   . Sulfa Antibiotics   . Tussionex Pennkinetic Er [Hydrocod Polst-Cpm Polst Er]   . Vicodin [Hydrocodone-Acetaminophen]    Review of Systems  Unable to perform ROS: Dementia   Physical Exam  Vital Signs: BP (!) 99/59 (BP Location: Right Arm)   Pulse (!) 54   Temp (!) 97.2 F (36.2 C) (Axillary)   Resp 18   Ht 5\' 3"  (1.6 m)   Wt 54 kg   SpO2 96%   BMI 21.09 kg/m  Pain Scale: PAINAD POSS *See Group Information*: S-Acceptable,Sleep, easy to arouse Pain Score: Asleep   SpO2: SpO2: 96 % O2 Device:SpO2: 96 % O2 Flow Rate: .   IO: Intake/output summary:   Intake/Output Summary (Last 24 hours) at 09/06/2019 1106 Last data filed at 09/06/2019 09/08/2019 Gross per 24 hour  Intake 622.57 ml  Output --  Net 622.57 ml    LBM: Last BM Date: (umknown) Baseline Weight: Weight: 54 kg Most recent weight: Weight: 54 kg     Palliative Assessment/Data: PPS 20%     Time In: 1030 Time Out: 1110 Time Total: 40 Greater than 50%  of this time was spent counseling and coordinating care related to the above assessment and plan.  The above conversation was completed via telephone due to visitor restrictions during COVID-19 pandemic. Thorough chart review and discussion with multidisciplinary team was completed as part of assessment. No physical examination was performed.   Signed by:  1111, DNP, FNP-C Palliative Medicine Team  Phone: [phone] Fax: [phone]  Please contact Palliative Medicine Team phone at [phone] for questions and concerns.  For individual provider: See [phone]

## 2019-09-06 NOTE — Progress Notes (Signed)
New referral for [employer] hospice services at [facility] received from [facility]. DME needs discussed patient has a hospital bed in place. Plan is for discharge back to the [facility] today. Per Charisse March no DME needed prior to discharge. Writer to contact patient's son to provide education regarding hospice serviced. Patient information given to referral. Thank you for the opportunity to be involved in the care of this patient. Dayna Barker BSN, RN, [facility] [employer] [phone]

## 2019-09-06 NOTE — NC FL2 (Addendum)
[place] LEVEL OF CARE SCREENING TOOL     IDENTIFICATION  Patient Name: Gloria Martinez Birthdate: 1919/08/17 Sex: female Admission Date (Current Location): 09/05/2019  [place] and [place] Number:  Engineering geologist and Address:  [facility], [address] [place], [place] [address]      Provider Number: [id number]  Attending Physician Name and Address:  Max Sane, MD  Relative Name and Phone Number:       Current Level of Care: Hospital Recommended Level of Care: [place] Prior Approval Number:    Date Approved/Denied:   PASRR Number:    Discharge Plan: Other (Comment)(Assisted Living)    Current Diagnoses: Patient Active Problem List   Diagnosis Date Noted  . Palliative care by specialist   . Terminal care   . Weakness   . Urinary tract infection without hematuria   . Dementia without behavioral disturbance ([place])   . COVID-19 09/05/2019  . Pelvic fracture ([place]) 03/24/2019  . Hypothyroidism 03/24/2019  . HTN (hypertension) 03/24/2019  . HLD (hyperlipidemia) 03/24/2019  . CAD (coronary artery disease) 03/24/2019  . Hip fracture ([place]) 08/09/2013    Orientation RESPIRATION BLADDER Height & Weight        Normal Incontinent Weight: 54 kg Height:  5\' 3"  (160 cm)  BEHAVIORAL SYMPTOMS/MOOD NEUROLOGICAL BOWEL NUTRITION STATUS      Incontinent Diet(dysphagia diet 1- Nectar thick liquids)  AMBULATORY STATUS COMMUNICATION OF NEEDS Skin   Extensive Assist Verbally Normal                       Personal Care Assistance Level of Assistance  Bathing, Feeding, Dressing Bathing Assistance: Limited assistance Feeding assistance: Limited assistance Dressing Assistance: Limited assistance     Functional Limitations Info             SPECIAL CARE FACTORS FREQUENCY                       Contractures Contractures Info: Not present    Additional Factors Info  Code Status, Allergies Code Status  Info: DNR Allergies Info: Cipro, macrobid, PCN, percocet , sulfa, tussionex, vicodin           Current Medications (09/06/2019):  This is the current hospital active medication list Current Facility-Administered Medications  Medication Dose Route Frequency Provider Last Rate Last Admin  . alum & mag hydroxide-simeth (MAALOX/MYLANTA) 200-200-20 MG/5ML suspension 30 mL  30 mL Oral Q6H PRN Max Sane, MD      . bisacodyl (DULCOLAX) suppository 10 mg  10 mg Rectal Daily PRN Basilio Cairo, NP      . brimonidine (ALPHAGAN) 0.2 % ophthalmic solution 1 drop  1 drop Both Eyes BID Max Sane, MD   1 drop at 09/05/19 2123  . diphenhydrAMINE (BENADRYL) injection 12.5 mg  12.5 mg Intravenous Q6H PRN Basilio Cairo, NP      . fosfomycin (MONUROL) packet 3 g  3 g Oral Once Harvest Dark, MD      . glycopyrrolate (ROBINUL) tablet 1 mg  1 mg Oral Q4H PRN Max Sane, MD       Or  . glycopyrrolate (ROBINUL) injection 0.2 mg  0.2 mg Subcutaneous Q4H PRN Max Sane, MD       Or  . glycopyrrolate (ROBINUL) injection 0.2 mg  0.2 mg Intravenous Q4H PRN Max Sane, MD      . guaiFENesin (ROBITUSSIN) 100 MG/5ML solution 200 mg  200  mg Oral QID PRN Delfino Lovett, MD      . latanoprost (XALATAN) 0.005 % ophthalmic solution 1 drop  1 drop Right Eye QHS Sherryll Burger, Vipul, MD      . LORazepam (ATIVAN) injection 0.5 mg  0.5 mg Intravenous Q6H PRN Alita Chyle, NP      . magnesium hydroxide (MILK OF MAGNESIA) suspension 30 mL  30 mL Oral Daily PRN Delfino Lovett, MD      . mineral oil-hydrophilic petrolatum (AQUAPHOR) ointment 1 application  1 application Topical PRN Sherryll Burger, Vipul, MD      . morphine 2 MG/ML injection 1 mg  1 mg Intravenous Q4H PRN Jimmye Norman, NP   1 mg at 09/05/19 2123  . morphine CONCENTRATE 10 MG/0.5ML oral solution 5 mg  5 mg Sublingual Q2H PRN Alita Chyle, NP      . polyvinyl alcohol (LIQUIFILM TEARS) 1.4 % ophthalmic solution 1 drop  1 drop Both Eyes QID PRN Delfino Lovett, MD      .  pyrithione zinc (HEAD AND SHOULDERS) 1 % shampoo 1 application  1 application Topical Daily PRN Delfino Lovett, MD      . zinc sulfate capsule 220 mg  220 mg Oral Daily Delfino Lovett, MD         Discharge Medications: Medication List    STOP taking these medications   acetaminophen 325 MG tablet Commonly known as: TYLENOL   alum & mag hydroxide-simeth 200-200-20 MG/5ML suspension Commonly known as: MAALOX/MYLANTA   amLODipine 5 MG tablet Commonly known as: NORVASC   aspirin 81 MG chewable tablet   barrier cream Crea Commonly known as: non-specified   brimonidine 0.2 % ophthalmic solution Commonly known as: ALPHAGAN   citalopram 10 MG tablet Commonly known as: CELEXA   D3-1000 25 MCG (1000 UT) tablet Generic drug: Cholecalciferol   diphenhydrAMINE 25 MG tablet Commonly known as: BENADRYL   furosemide 20 MG tablet Commonly known as: LASIX   GOLD BOND EX   guaiFENesin 100 MG/5ML liquid Commonly known as: ROBITUSSIN   hydrocortisone cream 1 %   ipratropium 0.03 % nasal spray Commonly known as: ATROVENT   latanoprost 0.005 % ophthalmic solution Commonly known as: XALATAN   levothyroxine 100 MCG tablet Commonly known as: SYNTHROID   loperamide 2 MG tablet Commonly known as: IMODIUM A-D   Lubricant Eye Drops 0.4-0.3 % Soln Generic drug: Polyethyl Glycol-Propyl Glycol   magnesium hydroxide 400 MG/5ML suspension Commonly known as: MILK OF MAGNESIA   Melatonin 3 MG Tabs   metoprolol succinate 100 MG 24 hr tablet Commonly known as: TOPROL-XL   neomycin-bacitracin-polymyxin ointment Commonly known as: NEOSPORIN   polyethylene glycol 17 g packet Commonly known as: MIRALAX / GLYCOLAX   potassium chloride 10 MEQ tablet Commonly known as: KLOR-CON   pyrithione zinc 1 % shampoo Commonly known as: HEAD AND SHOULDERS   senna-docusate 8.6-50 MG tablet Commonly known as: Senokot-S   traZODone 50 MG tablet Commonly known as:  DESYREL     TAKE these medications   LORazepam 0.5 MG tablet Commonly known as: Ativan Take 1 tablet (0.5 mg total) by mouth every 4 (four) hours as needed for anxiety.   morphine 20 MG/5ML solution Take 1.3 mLs (5.2 mg total) by mouth every 2 (two) hours as needed for pain.         Relevant Imaging Results:  Relevant Lab Results:   Additional Information SS# [id number]  Allayne Butcher, RN

## 2019-09-06 NOTE — TOC Initial Note (Signed)
Transition of Care [facility]) - Initial/Assessment Note    Patient Details  Name: Gloria Martinez MRN: [id number] Date of Birth: 16-Oct-1919  [facility] CM/SW Contact:    Shelbie Hutching, RN Phone Number: 09/06/2019, 12:46 PM  Clinical Narrative:                 Patient placed under observation for COVID.  [facility] is from [facility].  Plan is for patient to return to [facility] under hospice care.  If hospice can get the bed delivered to [facility] today the patient can discharge today.   Expected Discharge Plan: Assisted Living(with hospice care) Barriers to Discharge: Equipment Delay(need hospital bed to be delivered)   Patient Goals and CMS Choice Patient states their goals for this hospitalization and ongoing recovery are:: Son would like for the patient to be comfortable and return to [facility] under hospice. CMS Medicare.gov Compare Post Acute Care list provided to:: Patient Represenative (must comment)(son) Choice offered to / list presented to : [place] / Guardian  Expected Discharge Plan and Services Expected Discharge Plan: Assisted Living(with hospice care) In-house Referral: Hospice / Palliative Care Discharge Planning Services: CM Consult Post Acute Care Choice: Hospice Living arrangements for the past 2 months: [place]                 DME Arranged: Hospital bed DME Agency: Other - [place]) Date DME Agency Contacted: 09/06/19 Time DME Agency Contacted: [id number] Representative spoke with at DME Agency: Flo Shanks            Prior Living Arrangements/Services Living arrangements for the past 2 months: [place] Lives with:: Facility Resident Patient language and need for interpreter reviewed:: Yes Do you feel safe going back to the place where you live?: Yes      Need for Family Participation in Patient Care: Yes (Comment)(COVID) Care giver support system in place?: Yes  (comment)(son)   Criminal Activity/Legal Involvement Pertinent to Current Situation/Hospitalization: No - Comment as needed  Activities of Daily Living Home Assistive Devices/Equipment: None ADL Screening (condition at time of admission) Patient's cognitive ability adequate to safely complete daily activities?: No Is the patient deaf or have difficulty hearing?: Yes Does the patient have difficulty seeing, even when wearing glasses/contacts?: Yes Does the patient have difficulty concentrating, remembering, or making decisions?: Yes Patient able to express need for assistance with ADLs?: No Does the patient have difficulty dressing or bathing?: Yes Independently performs ADLs?: No Communication: Independent Dressing (OT): Dependent Is this a change from baseline?: Pre-admission baseline Grooming: Dependent Is this a change from baseline?: Pre-admission baseline Feeding: Needs assistance Is this a change from baseline?: Pre-admission baseline Bathing: Dependent Is this a change from baseline?: Pre-admission baseline Toileting: Dependent Is this a change from baseline?: Pre-admission baseline In/Out Bed: Dependent Is this a change from baseline?: Pre-admission baseline Walks in Home: Dependent Is this a change from baseline?: Pre-admission baseline Does the patient have difficulty walking or climbing stairs?: Yes Weakness of Legs: Both Weakness of Arms/Hands: Both  Permission Sought/Granted Permission sought to share information with : Family Supports, Chartered certified accountant granted to share information with : Yes, Verbal Permission Granted     Permission granted to share info w AGENCY: [place] granted to share info w Relationship: Son     Emotional Assessment         Alcohol / Substance Use: Not Applicable Psych Involvement:  No (comment)  Admission diagnosis:  COVID-19 [U07.1] Patient Active Problem List   Diagnosis Date Noted  .  Palliative care by specialist   . Terminal care   . Weakness   . Urinary tract infection without hematuria   . Dementia without behavioral disturbance (HCC)   . COVID-19 09/05/2019  . Pelvic fracture (HCC) 03/24/2019  . Hypothyroidism 03/24/2019  . HTN (hypertension) 03/24/2019  . HLD (hyperlipidemia) 03/24/2019  . CAD (coronary artery disease) 03/24/2019  . Hip fracture (HCC) 08/09/2013   PCP:  Lauro Regulus, MD Pharmacy:  No Pharmacies Listed    Social Determinants of Health (SDOH) Interventions    Readmission Risk Interventions No flowsheet data found.

## 2019-09-06 NOTE — Discharge Summary (Signed)
[facility] NAME: Gloria Martinez    MR#:  [id number]  DATE OF BIRTH:  09/23/1919  DATE OF ADMISSION:  09/05/2019   ADMITTING PHYSICIAN: Max Sane, MD  DATE OF DISCHARGE: 09/06/2019  PRIMARY CARE PHYSICIAN: Kirk Ruths, MD   ADMISSION DIAGNOSIS:  COVID-19 [U07.1] DISCHARGE DIAGNOSIS:  Active Problems:   COVID-19   Palliative care by specialist   Terminal care   Weakness   Urinary tract infection without hematuria   Dementia without behavioral disturbance ([place])  SECONDARY DIAGNOSIS:   Past Medical History:  Diagnosis Date  . Anxiety   . CHF (congestive heart failure) ([place])   . Coronary artery disease   . Dementia ([place])   . Diverticulitis   . Dysphagia   . Epilepsy ([place])   . Glaucoma   . Hyperlipidemia   . Hypertension   . Hypothyroidism   . Muscle weakness    HOSPITAL COURSE:  86 year old female with past medical history of dementia, coronary artery disease, glaucoma, hypertension, hyperlipidemia, hypothyroidism, history of CHF, anxiety  being admitted for acute metabolic encephalopathy  * Acute metabolic encephalopathy -Multifactorial from Covid/UTI, mental status close to baseline today  Covid infection: patient is on room air Asymptomatic bacteriuria: no need of Abx Essential hypertension Hypothyroidism - continue Synthroid, check TSH History of glaucoma-continue Alphagan, Xalantan.  Depression -patient willcont. Celexa.  Chronic diastolic CHF  Overall very poor prognosis  Had a long discussion with patient's son who is her healthcare power of attorney -he requests to keep her comfortable and agreeable with [facility]. DISCHARGE CONDITIONS:  fair CONSULTS OBTAINED:   DRUG ALLERGIES:   Allergies  Allergen Reactions  . Ciprofloxacin   . Macrobid [employer] Macro]   . Penicillins   . Percocet [Oxycodone-Acetaminophen]   . Sulfa Antibiotics   . Tussionex Pennkinetic Er [Hydrocod  Polst-Cpm Polst Er]   . Vicodin [Hydrocodone-Acetaminophen]    DISCHARGE MEDICATIONS:   Allergies as of 09/06/2019      Reactions   Ciprofloxacin    Macrobid [nitrofurantoin Monohyd Macro]    Penicillins    Percocet [oxycodone-acetaminophen]    Sulfa Antibiotics    Tussionex Pennkinetic Er [hydrocod Polst-cpm Polst Er]    Vicodin [hydrocodone-acetaminophen]       Medication List    STOP taking these medications   acetaminophen 325 MG tablet Commonly known as: TYLENOL   alum & mag hydroxide-simeth 035-009-38 MG/5ML suspension Commonly known as: MAALOX/MYLANTA   amLODipine 5 MG tablet Commonly known as: NORVASC   aspirin 81 MG chewable tablet   barrier cream Crea Commonly known as: non-specified   brimonidine 0.2 % ophthalmic solution Commonly known as: ALPHAGAN   citalopram 10 MG tablet Commonly known as: CELEXA   D3-1000 25 MCG (1000 UT) tablet Generic drug: Cholecalciferol   diphenhydrAMINE 25 MG tablet Commonly known as: BENADRYL   furosemide 20 MG tablet Commonly known as: LASIX   GOLD BOND EX   guaiFENesin 100 MG/5ML liquid Commonly known as: ROBITUSSIN   hydrocortisone cream 1 %   ipratropium 0.03 % nasal spray Commonly known as: ATROVENT   latanoprost 0.005 % ophthalmic solution Commonly known as: XALATAN   levothyroxine 100 MCG tablet Commonly known as: SYNTHROID   loperamide 2 MG tablet Commonly known as: IMODIUM A-D   Lubricant Eye Drops 0.4-0.3 % Soln Generic drug: Polyethyl Glycol-Propyl Glycol   magnesium hydroxide 400 MG/5ML suspension Commonly known as: MILK OF MAGNESIA   Melatonin 3 MG Tabs   metoprolol  succinate 100 MG 24 hr tablet Commonly known as: TOPROL-XL   neomycin-bacitracin-polymyxin ointment Commonly known as: NEOSPORIN   polyethylene glycol 17 g packet Commonly known as: MIRALAX / GLYCOLAX   potassium chloride 10 MEQ tablet Commonly known as: KLOR-CON   pyrithione zinc 1 % shampoo Commonly known as: HEAD  AND SHOULDERS   senna-docusate 8.6-50 MG tablet Commonly known as: Senokot-S   traZODone 50 MG tablet Commonly known as: DESYREL     TAKE these medications   LORazepam 0.5 MG tablet Commonly known as: Ativan Take 1 tablet (0.5 mg total) by mouth every 4 (four) hours as needed for anxiety.   morphine 20 MG/5ML solution Take 1.3 mLs (5.2 mg total) by mouth every 2 (two) hours as needed for pain.        DISCHARGE INSTRUCTIONS:   DIET:  Regular diet DISCHARGE CONDITION:  Fair ACTIVITY:  Activity as tolerated OXYGEN:  Home Oxygen: No.  Oxygen Delivery: room air DISCHARGE LOCATION:  nursing home/ [facility]   If you experience worsening of your admission symptoms, develop shortness of breath, life threatening emergency, suicidal or homicidal thoughts you must seek medical attention immediately by calling 911 or calling your MD immediately  if symptoms less severe.  You Must read complete instructions/literature along with all the possible adverse reactions/side effects for all the Medicines you take and that have been prescribed to you. Take any new Medicines after you have completely understood and accpet all the possible adverse reactions/side effects.   Please note  You were cared for by a hospitalist during your hospital stay. If you have any questions about your discharge medications or the care you received while you were in the hospital after you are discharged, you can call the unit and asked to speak with the hospitalist on call if the hospitalist that took care of you is not available. Once you are discharged, your primary care physician will handle any further medical issues. Please note that NO REFILLS for any discharge medications will be authorized once you are discharged, as it is imperative that you return to your primary care physician (or establish a relationship with a primary care physician if you do not have one) for your aftercare needs so  that they can reassess your need for medications and monitor your lab values.    On the day of Discharge:  VITAL SIGNS:  Blood pressure (!) 99/59, pulse (!) 54, temperature (!) 97.2 F (36.2 C), temperature source Axillary, resp. rate 18, height 5\' 3"  (1.6 m), weight 54 kg, SpO2 96 %. PHYSICAL EXAMINATION:  GENERAL:  86 y.o.-year-old patient lying in the bed with no acute distress.  Constitutional:awake Eyes: Normal exam ENT Head: Normocephalic and atraumatic. Mouth/Throat: Mucous membranes are moist. Cardiovascular: Normal rate, regular rhythm. Respiratory: Normal respiratory effort without tachypnea nor retractions. Breath sounds are clear  Gastrointestinal: Soft and nontender. No distention.No reaction to abdominal palpation. Musculoskeletal: Nontender with normal range of motion in all extremities. Neurologic: Normal speech and language. Able to move all extremities. Skin: Skin is warm, dry and intact.  Psychiatric: Mood and affect are normal.  DATA REVIEW:   CBC Recent Labs  Lab 09/05/19 1301  WBC 6.6  HGB 10.7*  HCT 32.8*  PLT 200    Chemistries  Recent Labs  Lab 09/05/19 1301  NA 138  K 3.5  CL 107  CO2 22  GLUCOSE 140*  BUN 18  CREATININE 0.77  CALCIUM 8.6*  AST 24  ALT 11  ALKPHOS 49  BILITOT 0.9     Microbiology Results  Results for orders placed or performed during the hospital encounter of 09/05/19  MRSA PCR Screening     Status: Abnormal   Collection Time: 09/05/19  5:33 AM   Specimen: Nasal Mucosa; Nasopharyngeal  Result Value Ref Range Status   MRSA by PCR POSITIVE (A) NEGATIVE Final    Comment:        The GeneXpert MRSA Assay (FDA approved for NASAL specimens only), is one component of a comprehensive MRSA colonization surveillance program. It is not intended to diagnose MRSA infection nor to guide or monitor treatment for MRSA infections. RESULT CALLED TO, READ BACK BY AND VERIFIED WITH: Kennyth Arnold 09/06/19 at  715. CST Performed at [facility], [address] [place], [place] [address]   Respiratory Panel by RT PCR (Flu A&B, Covid) - Nasopharyngeal Swab     Status: Abnormal   Collection Time: 09/05/19  2:40 PM   Specimen: Nasopharyngeal Swab  Result Value Ref Range Status   SARS Coronavirus 2 by RT PCR POSITIVE (A) NEGATIVE Final    Comment: RESULT CALLED TO, READ BACK BY AND VERIFIED WITH: ASHLEY MURRAY 09/05/19 @ 1547  MLK (NOTE) SARS-CoV-2 target nucleic acids are DETECTED. SARS-CoV-2 RNA is generally detectable in upper respiratory specimens  during the acute phase of infection. Positive results are indicative of the presence of the identified virus, but do not rule out bacterial infection or co-infection with other pathogens not detected by the test. Clinical correlation with patient history and other diagnostic information is necessary to determine patient infection status. The expected result is Negative. Fact Sheet for Patients:  https://www.moore.com/ Fact Sheet for Healthcare Providers: https://www.young.biz/ This test is not yet approved or cleared by the [place] FDA and  has been authorized for detection and/or diagnosis of SARS-CoV-2 by FDA under an Emergency Use Authorization (EUA).  This EUA will remain in effect (meaning this test can be used) for  the duration of  the COVID-19 declaration under Section 564(b)(1) of the Act, 21 U.S.C. section 360bbb-3(b)(1), unless the authorization is terminated or revoked sooner.    Influenza A by PCR NEGATIVE NEGATIVE Final   Influenza B by PCR NEGATIVE NEGATIVE Final    Comment: (NOTE) The Xpert Xpress SARS-CoV-2/FLU/RSV assay is intended as an aid in  the diagnosis of influenza from Nasopharyngeal swab specimens and  should not be used as a sole basis for treatment. Nasal washings and  aspirates are unacceptable for Xpert Xpress SARS-CoV-2/FLU/RSV  testing. Fact Sheet for  Patients: https://www.moore.com/ Fact Sheet for Healthcare Providers: https://www.young.biz/ This test is not yet approved or cleared by the [place] FDA and  has been authorized for detection and/or diagnosis of SARS-CoV-2 by  FDA under an Emergency Use Authorization (EUA). This EUA will remain  in effect (meaning this test can be used) for the duration of the  Covid-19 declaration under Section 564(b)(1) of the Act, 21  U.S.C. section 360bbb-3(b)(1), unless the authorization is  terminated or revoked. Performed at [facility], [address], [place], [place] [address]        Management plans discussed with the patient, family and they are in agreement.  CODE STATUS: DNR   TOTAL TIME TAKING CARE OF THIS PATIENT: 45 minutes.    Delfino Lovett M.D on 09/06/2019 at 2:36 PM  [facility]   CC: Primary care physician; Lauro Regulus, MD   Note: This dictation was prepared with Dragon dictation along  with smaller phrase technology. Any transcriptional errors that result from this process are unintentional.

## 2019-09-17 ENCOUNTER — Other Ambulatory Visit: Payer: Self-pay | Admitting: Internal Medicine

## 2019-09-17 DIAGNOSIS — U071 COVID-19: Secondary | ICD-10-CM

## 2019-10-03 ENCOUNTER — Other Ambulatory Visit: Payer: Self-pay | Admitting: Internal Medicine

## 2019-11-16 ENCOUNTER — Emergency Department

## 2019-11-16 ENCOUNTER — Observation Stay
Admission: EM | Admit: 2019-11-16 | Discharge: 2019-11-17 | Disposition: A | Discharge status: Skilled Nursing Facility | Attending: Internal Medicine | Admitting: Internal Medicine

## 2019-11-16 ENCOUNTER — Other Ambulatory Visit: Payer: Self-pay

## 2019-11-16 DIAGNOSIS — L89152 Pressure ulcer of sacral region, stage 2: Secondary | ICD-10-CM | POA: Insufficient documentation

## 2019-11-16 DIAGNOSIS — E039 Hypothyroidism, unspecified: Secondary | ICD-10-CM | POA: Diagnosis present

## 2019-11-16 DIAGNOSIS — K760 Fatty (change of) liver, not elsewhere classified: Secondary | ICD-10-CM | POA: Diagnosis not present

## 2019-11-16 DIAGNOSIS — I482 Chronic atrial fibrillation, unspecified: Secondary | ICD-10-CM

## 2019-11-16 DIAGNOSIS — Z66 Do not resuscitate: Secondary | ICD-10-CM | POA: Diagnosis not present

## 2019-11-16 DIAGNOSIS — F0390 Unspecified dementia without behavioral disturbance: Secondary | ICD-10-CM | POA: Diagnosis not present

## 2019-11-16 DIAGNOSIS — H409 Unspecified glaucoma: Secondary | ICD-10-CM | POA: Insufficient documentation

## 2019-11-16 DIAGNOSIS — R9082 White matter disease, unspecified: Secondary | ICD-10-CM | POA: Diagnosis not present

## 2019-11-16 DIAGNOSIS — M8588 Other specified disorders of bone density and structure, other site: Secondary | ICD-10-CM | POA: Diagnosis not present

## 2019-11-16 DIAGNOSIS — Z7982 Long term (current) use of aspirin: Secondary | ICD-10-CM | POA: Insufficient documentation

## 2019-11-16 DIAGNOSIS — I4891 Unspecified atrial fibrillation: Secondary | ICD-10-CM | POA: Diagnosis not present

## 2019-11-16 DIAGNOSIS — I714 Abdominal aortic aneurysm, without rupture: Secondary | ICD-10-CM | POA: Insufficient documentation

## 2019-11-16 DIAGNOSIS — Z8673 Personal history of transient ischemic attack (TIA), and cerebral infarction without residual deficits: Secondary | ICD-10-CM | POA: Diagnosis not present

## 2019-11-16 DIAGNOSIS — Z880 Allergy status to penicillin: Secondary | ICD-10-CM | POA: Insufficient documentation

## 2019-11-16 DIAGNOSIS — Z888 Allergy status to other drugs, medicaments and biological substances status: Secondary | ICD-10-CM | POA: Insufficient documentation

## 2019-11-16 DIAGNOSIS — R112 Nausea with vomiting, unspecified: Principal | ICD-10-CM

## 2019-11-16 DIAGNOSIS — J90 Pleural effusion, not elsewhere classified: Secondary | ICD-10-CM | POA: Insufficient documentation

## 2019-11-16 DIAGNOSIS — I509 Heart failure, unspecified: Secondary | ICD-10-CM | POA: Diagnosis not present

## 2019-11-16 DIAGNOSIS — Z885 Allergy status to narcotic agent status: Secondary | ICD-10-CM | POA: Insufficient documentation

## 2019-11-16 DIAGNOSIS — R8271 Bacteriuria: Secondary | ICD-10-CM | POA: Insufficient documentation

## 2019-11-16 DIAGNOSIS — R1110 Vomiting, unspecified: Secondary | ICD-10-CM | POA: Diagnosis present

## 2019-11-16 DIAGNOSIS — I2510 Atherosclerotic heart disease of native coronary artery without angina pectoris: Secondary | ICD-10-CM | POA: Diagnosis present

## 2019-11-16 DIAGNOSIS — Z882 Allergy status to sulfonamides status: Secondary | ICD-10-CM | POA: Insufficient documentation

## 2019-11-16 DIAGNOSIS — Z79890 Hormone replacement therapy (postmenopausal): Secondary | ICD-10-CM | POA: Insufficient documentation

## 2019-11-16 DIAGNOSIS — I110 Hypertensive heart disease with heart failure: Secondary | ICD-10-CM | POA: Insufficient documentation

## 2019-11-16 DIAGNOSIS — K8020 Calculus of gallbladder without cholecystitis without obstruction: Secondary | ICD-10-CM | POA: Diagnosis not present

## 2019-11-16 DIAGNOSIS — I10 Essential (primary) hypertension: Secondary | ICD-10-CM | POA: Diagnosis present

## 2019-11-16 DIAGNOSIS — G9389 Other specified disorders of brain: Secondary | ICD-10-CM | POA: Diagnosis not present

## 2019-11-16 DIAGNOSIS — F419 Anxiety disorder, unspecified: Secondary | ICD-10-CM | POA: Insufficient documentation

## 2019-11-16 DIAGNOSIS — M8468XA Pathological fracture in other disease, other site, initial encounter for fracture: Secondary | ICD-10-CM | POA: Insufficient documentation

## 2019-11-16 DIAGNOSIS — K862 Cyst of pancreas: Secondary | ICD-10-CM | POA: Diagnosis not present

## 2019-11-16 DIAGNOSIS — Z881 Allergy status to other antibiotic agents status: Secondary | ICD-10-CM | POA: Insufficient documentation

## 2019-11-16 DIAGNOSIS — I700 Atherosclerosis of aorta: Secondary | ICD-10-CM | POA: Diagnosis not present

## 2019-11-16 DIAGNOSIS — R1310 Dysphagia, unspecified: Secondary | ICD-10-CM

## 2019-11-16 DIAGNOSIS — K5900 Constipation, unspecified: Secondary | ICD-10-CM | POA: Diagnosis not present

## 2019-11-16 DIAGNOSIS — Z79899 Other long term (current) drug therapy: Secondary | ICD-10-CM | POA: Insufficient documentation

## 2019-11-16 DIAGNOSIS — Z96641 Presence of right artificial hip joint: Secondary | ICD-10-CM | POA: Insufficient documentation

## 2019-11-16 LAB — CBC WITH DIFFERENTIAL/PLATELET
Abs Immature Granulocytes: 0.02 10*3/uL (ref 0.00–0.07)
Basophils Absolute: 0.1 10*3/uL (ref 0.0–0.1)
Basophils Relative: 1 %
Eosinophils Absolute: 0.1 10*3/uL (ref 0.0–0.5)
Eosinophils Relative: 2 %
HCT: 36.1 % (ref 36.0–46.0)
Hemoglobin: 11.5 g/dL — ABNORMAL LOW (ref 12.0–15.0)
Immature Granulocytes: 0 %
Lymphocytes Relative: 25 %
Lymphs Abs: 1.7 10*3/uL (ref 0.7–4.0)
MCH: 36.7 pg — ABNORMAL HIGH (ref 26.0–34.0)
MCHC: 31.9 g/dL (ref 30.0–36.0)
MCV: 115.3 fL — ABNORMAL HIGH (ref 80.0–100.0)
Monocytes Absolute: 0.5 10*3/uL (ref 0.1–1.0)
Monocytes Relative: 7 %
Neutro Abs: 4.5 10*3/uL (ref 1.7–7.7)
Neutrophils Relative %: 65 %
Platelets: 222 10*3/uL (ref 150–400)
RBC: 3.13 MIL/uL — ABNORMAL LOW (ref 3.87–5.11)
RDW: 13.6 % (ref 11.5–15.5)
Smear Review: NORMAL
WBC: 6.9 10*3/uL (ref 4.0–10.5)
nRBC: 0 % (ref 0.0–0.2)

## 2019-11-16 LAB — TROPONIN I (HIGH SENSITIVITY)
Troponin I (High Sensitivity): 22 ng/L — ABNORMAL HIGH (ref <18)
Troponin I (High Sensitivity): 22 ng/L — ABNORMAL HIGH (ref <18)

## 2019-11-16 LAB — COMPREHENSIVE METABOLIC PANEL
ALT: 9 U/L (ref 0–44)
AST: 20 U/L (ref 15–41)
Albumin: 3.4 g/dL — ABNORMAL LOW (ref 3.5–5.0)
Alkaline Phosphatase: 53 U/L (ref 38–126)
Anion gap: 7 (ref 5–15)
BUN: 22 mg/dL (ref 8–23)
CO2: 27 mmol/L (ref 22–32)
Calcium: 8.7 mg/dL — ABNORMAL LOW (ref 8.9–10.3)
Chloride: 105 mmol/L (ref 98–111)
Creatinine, Ser: 0.95 mg/dL (ref 0.44–1.00)
GFR calc Af Amer: 58 mL/min — ABNORMAL LOW (ref >60)
GFR calc non Af Amer: 50 mL/min — ABNORMAL LOW (ref >60)
Glucose, Bld: 163 mg/dL — ABNORMAL HIGH (ref 70–99)
Potassium: 3.6 mmol/L (ref 3.5–5.1)
Sodium: 139 mmol/L (ref 135–145)
Total Bilirubin: 0.6 mg/dL (ref 0.3–1.2)
Total Protein: 6.5 g/dL (ref 6.5–8.1)

## 2019-11-16 LAB — URINALYSIS, COMPLETE (UACMP) WITH MICROSCOPIC
Bilirubin Urine: NEGATIVE
Glucose, UA: NEGATIVE mg/dL
Ketones, ur: NEGATIVE mg/dL
Leukocytes,Ua: NEGATIVE
Nitrite: POSITIVE — AB
Protein, ur: NEGATIVE mg/dL
Specific Gravity, Urine: >1.03 — ABNORMAL HIGH (ref 1.005–1.030)
pH: 6 (ref 5.0–8.0)

## 2019-11-16 LAB — LIPASE, BLOOD: Lipase: 46 U/L (ref 11–51)

## 2019-11-16 LAB — LACTIC ACID, PLASMA
Lactic Acid, Venous: 2.4 mmol/L (ref 0.5–1.9)
Lactic Acid, Venous: 2.2 mmol/L (ref 0.5–1.9)

## 2019-11-16 MED ORDER — BRIMONIDINE TARTRATE 0.2 % OP SOLN
1.0000 [drp] | Freq: Two times a day (BID) | OPHTHALMIC | Status: DC
  Administered 2019-11-17 (×2): 1 [drp] via OPHTHALMIC
  Filled 2019-11-16: qty 5

## 2019-11-16 MED ORDER — CITALOPRAM HYDROBROMIDE 20 MG PO TABS
10.0000 mg | ORAL_TABLET | Freq: Every day | ORAL | Status: DC
  Administered 2019-11-17: 10 mg via ORAL
  Filled 2019-11-16: qty 1

## 2019-11-16 MED ORDER — SODIUM CHLORIDE 0.9 % IV SOLN
1.0000 g | Freq: Once | INTRAVENOUS | Status: AC
  Administered 2019-11-16: 21:00:00 1 g via INTRAVENOUS
  Filled 2019-11-16: qty 10

## 2019-11-16 MED ORDER — METOPROLOL SUCCINATE ER 100 MG PO TB24: 100.0000 mg | ORAL_TABLET | Freq: Every day | ORAL | Status: DC

## 2019-11-16 MED ORDER — ENOXAPARIN SODIUM 40 MG/0.4ML ~~LOC~~ SOLN
30.0000 mg | SUBCUTANEOUS | Status: DC
  Filled 2019-11-16 (×2): qty 0.4

## 2019-11-16 MED ORDER — POLYETHYLENE GLYCOL 3350 17 G PO PACK: 17.0000 g | PACK | ORAL | Status: DC

## 2019-11-16 MED ORDER — LEVOTHYROXINE SODIUM 100 MCG PO TABS: 100.0000 ug | ORAL_TABLET | Freq: Every day | ORAL | Status: DC

## 2019-11-16 MED ORDER — MELATONIN 5 MG PO TABS: 2.5000 mg | ORAL_TABLET | Freq: Every day | ORAL | Status: DC

## 2019-11-16 MED ORDER — IOHEXOL 300 MG/ML  SOLN
75.0000 mL | Freq: Once | INTRAMUSCULAR | Status: AC | PRN
  Administered 2019-11-16: 75 mL via INTRAVENOUS

## 2019-11-16 MED ORDER — DEXTROSE-NACL 5-0.45 % IV SOLN: INTRAVENOUS | Status: DC

## 2019-11-16 MED ORDER — TRAZODONE HCL 50 MG PO TABS: 50.0000 mg | ORAL_TABLET | Freq: Every day | ORAL | Status: DC

## 2019-11-16 MED ORDER — LATANOPROST 0.005 % OP SOLN
1.0000 [drp] | Freq: Every day | OPHTHALMIC | Status: DC
  Administered 2019-11-17: 1 [drp] via OPHTHALMIC
  Filled 2019-11-16: qty 2.5

## 2019-11-16 MED ORDER — SENNOSIDES-DOCUSATE SODIUM 8.6-50 MG PO TABS
1.0000 | ORAL_TABLET | Freq: Every day | ORAL | Status: DC
  Filled 2019-11-16: qty 1

## 2019-11-16 MED ORDER — SODIUM CHLORIDE 0.9 % IV BOLUS
1000.0000 mL | Freq: Once | INTRAVENOUS | Status: AC
  Administered 2019-11-16: 1000 mL via INTRAVENOUS

## 2019-11-16 MED ORDER — PANTOPRAZOLE SODIUM 40 MG IV SOLR
40.0000 mg | Freq: Two times a day (BID) | INTRAVENOUS | Status: DC
  Administered 2019-11-17 (×2): 40 mg via INTRAVENOUS
  Filled 2019-11-16 (×2): qty 40

## 2019-11-16 NOTE — ED Triage Notes (Signed)
Pt arrives via [facility] from [facility] for staff c/o patient choking on foods when eating and subsequent vomiting. Staff reports pt seems more lethargic than usual and seems to be constipated, unsure when last BM was. PT A&Ox4 and in NAD at this time. Staff reports pt choked on medications this morning and threw up shortly after.

## 2019-11-16 NOTE — ED Notes (Signed)
Dr. Siadecki informed of critical lactic 

## 2019-11-16 NOTE — ED Notes (Signed)
Blue, lavender, light green and grey tubes sent to lab 

## 2019-11-16 NOTE — ED Provider Notes (Signed)
[facility] Emergency Department Provider Note ____________________________________________   First MD Initiated Contact with Patient 11/16/19 1523     (approximate)  I have reviewed the triage vital signs and the nursing notes.   HISTORY  Chief Complaint Emesis  Level 5 caveat: History present due to dementia  HPI Gloria Martinez is a 86 y.o. female with PMH as noted below who presents from her facility with an episode of choking on food while eating, with subsequent vomiting and altered mental status.  Facility staff reports that the patient is more lethargic than usual.  The patient herself cannot give much history and denies any acute pain or other complaints.  She states that she remembers vomiting before, but no longer feels nauseous.  Past Medical History:  Diagnosis Date  . Anxiety   . CHF (congestive heart failure) ([facility])   . Coronary artery disease   . Dementia ([facility])   . Diverticulitis   . Dysphagia   . Epilepsy ([facility])   . Glaucoma   . Hyperlipidemia   . Hypertension   . Hypothyroidism   . Muscle weakness     Patient Active Problem List   Diagnosis Date Noted  . Palliative care by specialist   . Terminal care   . Weakness   . Urinary tract infection without hematuria   . Dementia without behavioral disturbance ([facility])   . COVID-19 09/05/2019  . Pelvic fracture ([facility]) 03/24/2019  . Hypothyroidism 03/24/2019  . HTN (hypertension) 03/24/2019  . HLD (hyperlipidemia) 03/24/2019  . CAD (coronary artery disease) 03/24/2019  . Hip fracture ([facility]) 08/09/2013    Past Surgical History:  Procedure Laterality Date  . ABDOMINAL HYSTERECTOMY    . Hip Replacement  Right 2009    Prior to Admission medications   Medication Sig Start Date End Date Taking? Authorizing Provider  acetaminophen (TYLENOL) 325 MG tablet Take 650 mg by mouth every 6 (six) hours as needed for mild pain or fever.   Yes [provider]  acetaminophen (TYLENOL) 500  MG tablet Take 1,000 mg by mouth in the morning, at noon, and at bedtime.   Yes [provider]  alum & mag hydroxide-simeth (MAALOX/MYLANTA) 200-200-20 MG/5ML suspension Take 30 mLs by mouth every 4 (four) hours as needed for indigestion or heartburn.   Yes [provider]  ascorbic acid (VITAMIN C) 500 MG tablet Take 500 mg by mouth 2 (two) times daily.   Yes [provider]  aspirin 81 MG chewable tablet Chew 81 mg by mouth daily.   Yes [provider]  brimonidine (ALPHAGAN) 0.2 % ophthalmic solution Place 1 drop into both eyes in the morning and at bedtime.   Yes [provider]  carboxymethylcellulose (REFRESH TEARS) 0.5 % SOLN Place 1 drop into both eyes in the morning and at bedtime.   Yes [provider]  cholecalciferol (VITAMIN D3) 25 MCG (1000 UNIT) tablet Take 2,000 Units by mouth daily.   Yes [provider]  citalopram (CELEXA) 10 MG tablet Take 10 mg by mouth daily. 10/18/19  Yes [provider]  diphenhydrAMINE (BENADRYL) 25 mg capsule Take 25 mg by mouth every 6 (six) hours as needed for itching or allergies.   Yes [provider]  guaifenesin (ROBITUSSIN) 100 MG/5ML syrup Take 200 mg by mouth every 4 (four) hours as needed for cough.   Yes [provider]  latanoprost (XALATAN) 0.005 % ophthalmic solution Place 1 drop into the right eye at bedtime.  Yes [provider]  levothyroxine (SYNTHROID) 100 MCG tablet Take 100 mcg by mouth daily before breakfast.   Yes [provider]  loperamide (IMODIUM) 2 MG capsule Take 2 mg by mouth 4 (four) times daily as needed for diarrhea or loose stools.   Yes [provider]  magnesium hydroxide (MILK OF MAGNESIA) 400 MG/5ML suspension Take 30 mLs by mouth daily as needed for mild constipation.   Yes [provider]  melatonin 3 MG TABS tablet Take 3 mg by mouth at bedtime.   Yes [provider]  menthol-zinc oxide  (GOLD BOND) powder Apply 1 Dose topically daily as needed (irritation).   Yes [provider]  metoprolol succinate (TOPROL-XL) 100 MG 24 hr tablet Take 100 mg by mouth daily. Take with or immediately following a meal.   Yes [provider]  Multiple Vitamins-Minerals (CEROVITE SENIOR) TABS Take 1 tablet by mouth daily.   Yes [provider]  neomycin-bacitracin-polymyxin (NEOSPORIN) ointment Apply 1 application topically daily as needed for wound care.   Yes [provider]  polyethylene glycol (MIRALAX / GLYCOLAX) 17 g packet Take 17 g by mouth every other day.   Yes [provider]  pyrithione zinc (HEAD AND SHOULDERS) 1 % shampoo Apply 1 application topically daily as needed for itching. (apply to scalp)   Yes [provider]  sennosides-docusate sodium (SENOKOT-S) 8.6-50 MG tablet Take 1 tablet by mouth in the morning and at bedtime.   Yes [provider]  traZODone (DESYREL) 50 MG tablet Take 50 mg by mouth at bedtime.   Yes [provider]  zinc sulfate 220 (50 Zn) MG capsule Take 220 mg by mouth daily.   Yes [provider]    Allergies Ciprofloxacin, Macrobid [nitrofurantoin monohyd macro], Penicillins, Percocet [oxycodone-acetaminophen], Sulfa antibiotics, Tussionex pennkinetic er [hydrocod polst-cpm polst er], and Vicodin [hydrocodone-acetaminophen]  History reviewed. No pertinent family history.  Social History Social History   Tobacco Use  . Smoking status: Never Smoker  . Smokeless tobacco: Never Used  Substance Use Topics  . Alcohol use: No  . Drug use: No    Review of Systems Level 5 caveat: Unable to obtain review of systems due to dementia    ____________________________________________   PHYSICAL EXAM:  VITAL SIGNS: ED Triage Vitals  Enc Vitals Group     BP 11/16/19 1519 127/66     Pulse Rate 11/16/19 1519 61     Resp 11/16/19 1519 18     Temp 11/16/19 1519 99.4 F (37.4 C)       Temp Source 11/16/19 1519 Oral     SpO2 11/16/19 1519 97 %     Weight 11/16/19 1521 110 lb (49.9 kg)     Height 11/16/19 1521 5\' 5"  (1.651 m)     Head Circumference --      Peak Flow --      Pain Score 11/16/19 1521 0     Pain Loc --      Pain Edu? --      Excl. in GC? --     Constitutional: Alert, oriented x2.  Relatively well appearing for age. Eyes: Conjunctivae are normal.  Head: Atraumatic. Nose: No congestion/rhinnorhea. Mouth/Throat: Mucous membranes are slightly dry.   Neck: Normal range of motion.  Cardiovascular: Normal rate, regular rhythm. Grossly normal heart sounds.  Good peripheral circulation. Respiratory: Normal respiratory effort.  No retractions. Lungs CTAB. Gastrointestinal: Soft and nontender. No distention.  Genitourinary: No flank tenderness. Musculoskeletal: No lower  extremity edema.  Extremities warm and well perfused.  Neurologic: Motor intact in all extremities. Skin:  Skin is warm and dry. No rash noted. Psychiatric: Calm and cooperative.  ____________________________________________   LABS (all labs ordered are listed, but only abnormal results are displayed)  Labs Reviewed  COMPREHENSIVE METABOLIC PANEL - Abnormal; Notable for the following components:      Result Value   Glucose, Bld 163 (*)    Calcium 8.7 (*)    Albumin 3.4 (*)    GFR calc non Af Amer 50 (*)    GFR calc Af Amer 58 (*)    All other components within normal limits  CBC WITH DIFFERENTIAL/PLATELET - Abnormal; Notable for the following components:   RBC 3.13 (*)    Hemoglobin 11.5 (*)    MCV 115.3 (*)    MCH 36.7 (*)    All other components within normal limits  LACTIC ACID, PLASMA - Abnormal; Notable for the following components:   Lactic Acid, Venous 2.4 (*)    All other components within normal limits  LACTIC ACID, PLASMA - Abnormal; Notable for the following components:   Lactic Acid, Venous 2.2 (*)    All other components within normal limits  URINALYSIS,  COMPLETE (UACMP) WITH MICROSCOPIC - Abnormal; Notable for the following components:   APPearance CLOUDY (*)    Specific Gravity, Urine >1.030 (*)    Hgb urine dipstick SMALL (*)    Nitrite POSITIVE (*)    Bacteria, UA MANY (*)    All other components within normal limits  TROPONIN I (HIGH SENSITIVITY) - Abnormal; Notable for the following components:   Troponin I (High Sensitivity) 22 (*)    All other components within normal limits  TROPONIN I (HIGH SENSITIVITY) - Abnormal; Notable for the following components:   Troponin I (High Sensitivity) 22 (*)    All other components within normal limits  LIPASE, BLOOD   ____________________________________________  EKG  ED ECG REPORT I, Dionne Bucy, the attending physician, personally viewed and interpreted this ECG.  Date: 11/16/2019 EKG Time: 1524 Rate: 60 Rhythm: normal sinus rhythm (incorrectly read as atrial fibrillation by the machine) QRS Axis: Right axis Intervals: normal ST/T Wave abnormalities: Nonspecific T wave abnormalities Narrative Interpretation: no evidence of acute ischemia  ____________________________________________  RADIOLOGY  CXR: No focal infiltrate or other acute abnormality  ____________________________________________   PROCEDURES  Procedure(s) performed: No  Procedures  Critical Care performed: No ____________________________________________   INITIAL IMPRESSION / ASSESSMENT AND PLAN / ED COURSE  Pertinent labs & imaging results that were available during my care of the patient were reviewed by me and considered in my medical decision making (see chart for details).  86 year old female with PMH as noted above presents with an episode of choking, subsequent vomiting and increased confusion/lethargy.  I reviewed the past medical records in Epic.  The patient was most recently admitted in January of this year with Covid and altered mental status as well as a possible UTI.  On exam  currently she is alert and oriented x2.  She has no active vomiting and appears relatively well.  She does remember vomiting earlier but is otherwise unable to give much history.  The abdomen is soft and nontender.  Neurologic exam is nonfocal.  Overall differential is broad but includes aspiration, gastroenteritis, gastritis, UTI or other infection, or less likely possible small bowel obstruction.  We will obtain lab work-up, CT head and abdomen, and reassess.  ----------------------------------------- 7:28 PM on 11/16/2019 -----------------------------------------  Urinalysis  shows findings compatible with possible UTI.  The patient's lactic acid is slightly elevated, although this also may just be related to vomiting and dehydration.  Her other lab work-up is reassuring.  Given the concern for UTI and the patient's inability to tolerate p.o. earlier, I discussed the possibility of observation admission with her son over the phone and he agreed.  I discussed the case with the hospitalist.  ____________________________________________   FINAL CLINICAL IMPRESSION(S) / ED DIAGNOSES  Final diagnoses:  Non-intractable vomiting with nausea, unspecified vomiting type      NEW MEDICATIONS STARTED DURING THIS VISIT:  New Prescriptions   No medications on file     Note:  This document was prepared using Dragon voice recognition software and may include unintentional dictation errors.   Arta Silence, MD 11/16/19 1929

## 2019-11-16 NOTE — H&P (Addendum)
History and Physical    Gloria Martinez KXF:[id number] DOB: [birth year] DOA: 11/16/2019  PCP: Lauro Regulus, MD  Patient coming from: [facility] assisted living via ems   Chief Complaint: choking/vomiting  HPI: Gloria Martinez is a 86 y.o. female with medical history significant for hospice care, history hip fracture, hypothyroidism, htn, CAD, covid January of this year, TIA, dementia, who presents with above.  Per ED staff and son (who lives in [place] but spoke w/ staff from ALF), for one or two days patient has been observed choking on food when eating with subsequent vomiting. No reports of fever or complaints of abdominal or other pain. Patient is demented and oriented to self, not complaining of pain. No reports of cough or labored breathing. Son says the patient has a history of needing to have her "throat stretched" several times over the years for difficulty swallowing foods, unsure where. No report of hematemesis. She is also reportedly constipated, unclear when last BM.  ED Course: labs, imaging, 1L NS, ceftriaxone. Patient is on hospice but EDP spoke with patient's son who says he does want at least overnight observation, which I confirmed with him.  Review of Systems: As per HPI otherwise 10 point review of systems negative.    Past Medical History:  Diagnosis Date  . Anxiety   . CHF (congestive heart failure) (HCC)   . Coronary artery disease   . Dementia (HCC)   . Diverticulitis   . Dysphagia   . Epilepsy (HCC)   . Glaucoma   . Hyperlipidemia   . Hypertension   . Hypothyroidism   . Muscle weakness     Past Surgical History:  Procedure Laterality Date  . ABDOMINAL HYSTERECTOMY    . Hip Replacement  Right 2009     reports that she has never smoked. She has never used smokeless tobacco. She reports that she does not drink alcohol or use drugs.  Allergies  Allergen Reactions  . Ciprofloxacin   . Macrobid [employer] Macro]   . Penicillins     . Percocet [Oxycodone-Acetaminophen]   . Sulfa Antibiotics   . Tussionex Pennkinetic Er [Hydrocod Polst-Cpm Polst Er]   . Vicodin [Hydrocodone-Acetaminophen]     History reviewed. No pertinent family history.  Prior to Admission medications   Medication Sig Start Date End Date Taking? Authorizing Provider  acetaminophen (TYLENOL) 325 MG tablet Take 650 mg by mouth every 6 (six) hours as needed for mild pain or fever.   Yes [provider]  acetaminophen (TYLENOL) 500 MG tablet Take 1,000 mg by mouth in the morning, at noon, and at bedtime.   Yes [provider]  alum & mag hydroxide-simeth (MAALOX/MYLANTA) 200-200-20 MG/5ML suspension Take 30 mLs by mouth every 4 (four) hours as needed for indigestion or heartburn.   Yes [provider]  ascorbic acid (VITAMIN C) 500 MG tablet Take 500 mg by mouth 2 (two) times daily.   Yes [provider]  aspirin 81 MG chewable tablet Chew 81 mg by mouth daily.   Yes [provider]  brimonidine (ALPHAGAN) 0.2 % ophthalmic solution Place 1 drop into both eyes in the morning and at bedtime.   Yes [provider]  carboxymethylcellulose (REFRESH TEARS) 0.5 % SOLN Place 1 drop into both eyes in the morning and at bedtime.   Yes [provider]  cholecalciferol (VITAMIN D3) 25 MCG (1000 UNIT) tablet Take 2,000 Units by mouth daily.   Yes [provider]  citalopram (CELEXA) 10 MG tablet Take 10 mg by mouth daily. 10/18/19  Yes [provider]  diphenhydrAMINE (BENADRYL) 25 mg capsule Take 25 mg by mouth every 6 (six) hours as needed for itching or allergies.   Yes [provider]  guaifenesin (ROBITUSSIN) 100 MG/5ML syrup Take 200 mg by mouth every 4 (four) hours as needed for cough.   Yes [provider]  latanoprost (XALATAN) 0.005 % ophthalmic solution Place 1 drop into the right eye at bedtime.   Yes [provider]  levothyroxine (SYNTHROID) 100 MCG  tablet Take 100 mcg by mouth daily before breakfast.   Yes [provider]  loperamide (IMODIUM) 2 MG capsule Take 2 mg by mouth 4 (four) times daily as needed for diarrhea or loose stools.   Yes [provider]  magnesium hydroxide (MILK OF MAGNESIA) 400 MG/5ML suspension Take 30 mLs by mouth daily as needed for mild constipation.   Yes [provider]  melatonin 3 MG TABS tablet Take 3 mg by mouth at bedtime.   Yes [provider]  menthol-zinc oxide (GOLD BOND) powder Apply 1 Dose topically daily as needed (irritation).   Yes [provider]  metoprolol succinate (TOPROL-XL) 100 MG 24 hr tablet Take 100 mg by mouth daily. Take with or immediately following a meal.   Yes [provider]  Multiple Vitamins-Minerals (CEROVITE SENIOR) TABS Take 1 tablet by mouth daily.   Yes [provider]  neomycin-bacitracin-polymyxin (NEOSPORIN) ointment Apply 1 application topically daily as needed for wound care.   Yes [provider]  polyethylene glycol (MIRALAX / GLYCOLAX) 17 g packet Take 17 g by mouth every other day.   Yes [provider]  pyrithione zinc (HEAD AND SHOULDERS) 1 % shampoo Apply 1 application topically daily as needed for itching. (apply to scalp)   Yes [provider]  sennosides-docusate sodium (SENOKOT-S) 8.6-50 MG tablet Take 1 tablet by mouth in the morning and at bedtime.   Yes [provider]  traZODone (DESYREL) 50 MG tablet Take 50 mg by mouth at bedtime.   Yes [provider]  zinc sulfate 220 (50 Zn) MG capsule Take 220 mg by mouth daily.   Yes [provider]    Physical Exam: Vitals:   11/16/19 1519 11/16/19 1521  BP: 127/66   Pulse: 61   Resp: 18   Temp: 99.4 F (37.4 C)   TempSrc: Oral   SpO2: 97%   Weight:  49.9 kg  Height:  5\' 5"  (1.651 m)    Constitutional: No acute distress, chronically ill appearing Head: Atraumatic Eyes: Conjunctiva  clear ENM: dry mucous membranes. poordentition.  Neck: Supple Respiratory: Clear to auscultation bilaterally, save for faint rales at bases, normal WOB Cardiovascular: irreg irreg, soft systolic murmur Abdomen: Non-tender, non-distended. No masses. No rebound or guarding. Positive bowel sounds. Musculoskeletal: No joint deformity upper and lower extremities. Normal ROM, no contractures. Decreased muscle tone.  Skin: stage 2 sacral decub Extremities: No peripheral edema. Palpable peripheral pulses. Neurologic: Alert, moving all 4 extremities. Psychiatric: alert, knows name, hearing is relatively good   Labs on Admission: I have personally reviewed following labs and imaging studies  CBC: Recent Labs  Lab 11/16/19 1534  WBC 6.9  NEUTROABS 4.5  HGB 11.5*  HCT 36.1  MCV 115.3*  PLT 222   Basic Metabolic Panel: Recent Labs  Lab 11/16/19 1534  NA 139  K 3.6  CL 105  CO2 27  GLUCOSE 163*  BUN  22  CREATININE 0.95  CALCIUM 8.7*   GFR: Estimated Creatinine Clearance: 26 mL/min (by C-G formula based on SCr of 0.95 mg/dL). Liver Function Tests: Recent Labs  Lab 11/16/19 1534  AST 20  ALT 9  ALKPHOS 53  BILITOT 0.6  PROT 6.5  ALBUMIN 3.4*   Recent Labs  Lab 11/16/19 1534  LIPASE 46   No results for input(s): AMMONIA in the last 168 hours. Coagulation Profile: No results for input(s): INR, PROTIME in the last 168 hours. Cardiac Enzymes: No results for input(s): CKTOTAL, CKMB, CKMBINDEX, TROPONINI in the last 168 hours. BNP (last 3 results) No results for input(s): PROBNP in the last 8760 hours. HbA1C: No results for input(s): HGBA1C in the last 72 hours. CBG: No results for input(s): GLUCAP in the last 168 hours. Lipid Profile: No results for input(s): CHOL, HDL, LDLCALC, TRIG, CHOLHDL, LDLDIRECT in the last 72 hours. Thyroid Function Tests: No results for input(s): TSH, T4TOTAL, FREET4, T3FREE, THYROIDAB in the last 72 hours. Anemia Panel: No results for  input(s): VITAMINB12, FOLATE, FERRITIN, TIBC, IRON, RETICCTPCT in the last 72 hours. Urine analysis:    Component Value Date/Time   COLORURINE YELLOW 11/16/2019 1751   APPEARANCEUR CLOUDY (A) 11/16/2019 1751   APPEARANCEUR Hazy 02/06/2013 1340   LABSPEC >1.030 (H) 11/16/2019 1751   LABSPEC 1.006 02/06/2013 1340   PHURINE 6.0 11/16/2019 1751   GLUCOSEU NEGATIVE 11/16/2019 1751   GLUCOSEU Negative 02/06/2013 1340   HGBUR SMALL (A) 11/16/2019 1751   BILIRUBINUR NEGATIVE 11/16/2019 1751   BILIRUBINUR Negative 02/06/2013 1340   KETONESUR NEGATIVE 11/16/2019 1751   PROTEINUR NEGATIVE 11/16/2019 1751   NITRITE POSITIVE (A) 11/16/2019 1751   LEUKOCYTESUR NEGATIVE 11/16/2019 1751   LEUKOCYTESUR 3+ 02/06/2013 1340    Radiological Exams on Admission: CT Head Wo Contrast  Result Date: 11/16/2019 CLINICAL DATA:  Altered mental status. EXAM: CT HEAD WITHOUT CONTRAST TECHNIQUE: Contiguous axial images were obtained from the base of the skull through the vertex without intravenous contrast. COMPARISON:  09/05/2019 FINDINGS: Brain: Stable age related significant cerebral atrophy, ventriculomegaly and periventricular white matter disease. No extra-axial fluid collections are identified. No CT findings for acute hemispheric infarction or intracranial hemorrhage. No mass lesions. The brainstem and cerebellum are normal. Vascular: Stable vascular calcifications with a dolichoectatic left vertebral artery. No hyperdense vessels. Skull: No skull fracture or bone lesions. Stable hyperostosis frontalis interna. Sinuses/Orbits: The paranasal sinuses and mastoid air cells are clear. The globes are intact. Other: No scalp lesions or hematoma. IMPRESSION: 1. Stable age related cerebral atrophy, ventriculomegaly and periventricular white matter disease. 2. No acute intracranial findings or mass lesions. Electronically Signed   By: Marijo Sanes M.D.   On: 11/16/2019 18:33   CT ABDOMEN PELVIS W CONTRAST  Result Date:  11/16/2019 CLINICAL DATA:  86 year old with nausea and vomiting. EXAM: CT ABDOMEN AND PELVIS WITH CONTRAST TECHNIQUE: Multidetector CT imaging of the abdomen and pelvis was performed using the standard protocol following bolus administration of intravenous contrast. CONTRAST:  20mL OMNIPAQUE IOHEXOL 300 MG/ML  SOLN COMPARISON:  CT 01/08/2013 FINDINGS: Lower chest: Mild cardiomegaly with coronary artery calcifications. Trace left pleural effusion with adjacent atelectasis. Mild dependent atelectasis at the right lung base. Hepatobiliary: Hepatic steatosis without evidence of focal hepatic lesion. Small gallstone within the gallbladder. No pericholecystic inflammation. Prominent common bile duct at 8 mm, normal for age. Pancreas: Parenchymal atrophy. No ductal dilatation or inflammation. Lobulated cystic lesion arising from the pancreatic tail measures 2.7 x 3.7 cm, previously 1.4 x 0.9  cm. No surrounding inflammation. Spleen: Normal in size. Small cleft posteriorly. Adrenals/Urinary Tract: No adrenal nodule. Mild bilateral renal parenchymal thinning. No hydronephrosis. 18 mm cyst in the upper left kidney. Additional tiny cortical hypodensities in the right and left kidney are too small to characterize. No hydronephrosis. No perinephric edema. Homogeneous renal enhancement with symmetric excretion on delayed phase imaging. Urinary bladder is partially distended. Questionable bladder wall enhancement with mild wall thickening. Stomach/Bowel: Mild wall thickening at the gastroesophageal junction. Fluid-filled stomach with equivocal gastric wall enhancement. No small bowel obstruction or significant inflammation. Appendix tentatively visualized and normal, regardless, no appendicitis. Cecum is high-riding in the right mid abdomen. Moderate volume of stool throughout the colon. Transverse colon is tortuous. Stool distends the rectum with rectal dimension of 7.9 cm. No definite rectal wall thickening or adjacent  inflammation, detailed evaluation partially obscured by streak artifact from right hip arthroplasty. Vascular/Lymphatic: Infrarenal aortic aneurysm, increased in size from prior exam, current maximal dimension 3.6 cm. Calcified plaque in the central mural thrombus. No periaortic stranding. Portal vein is patent. No suspicious adenopathy. Reproductive: Status post hysterectomy. No adnexal masses. Other: No ascites or free air. Musculoskeletal: Moderate to severe T9 compression fracture. Moderate T12 compression fracture. Minor L1 and L4 compression fractures. These are age indeterminate. Bones are diffusely under mineralized. Remote posterior right rib fractures. Right hip arthroplasty. IMPRESSION: 1. Mild wall thickening at the gastroesophageal junction and equivocal gastric wall enhancement, can be seen with gastritis or peptic ulcer disease. 2. Moderate stool burden throughout the colon with stool distending the rectum, suggesting constipation. No bowel obstruction. 3. Lobulated cystic lesion arising from the pancreatic tail, current maximal dimension 3.6 cm. This is increased in size from 2014 MRI. Consensus guidelines are as follows, in giving consideration for patient's advanced age: Recommend follow up pre and post contrast MRI/MRCP or pancreatic protocol CT in 2 years. This recommendation follows ACR consensus guidelines: Management of Incidental Pancreatic Cysts: A White Paper of the ACR Incidental Findings Committee. J Am Coll Radiol 2017;14:911-923. 4. Cholelithiasis without gallbladder inflammation. 5. Infrarenal aortic aneurysm, maximal dimension 3.6 cm. Recommend followup by ultrasound in 3 years, giving consideration for patient's advanced age. This recommendation follows ACR consensus guidelines: White Paper of the ACR Incidental Findings Committee II on Vascular Findings. J Am Coll Radiol 2013; 10: 6. Osteopenia with multilevel compression fractures in the lower thoracic and lumbar spine, age  indeterminate. Electronically Signed   By: Narda Rutherford M.D.   On: 11/16/2019 18:43   DG Chest Portable 1 View  Result Date: 11/16/2019 CLINICAL DATA:  Cough, vomiting, lethargy EXAM: PORTABLE CHEST 1 VIEW COMPARISON:  03/24/2019 FINDINGS: Single frontal view of the chest demonstrates a stable cardiac silhouette. Prominent atherosclerosis of the aortic arch. Focal increased density over the right posterior fifth rib likely reflects confluence of shadows from the first anterior costochondral junction and underlying vessels, stable. No acute airspace disease, effusion, or pneumothorax. Chronic left rib fractures are seen. No acute bony abnormalities. IMPRESSION: 1. Stable chest, no acute process. Electronically Signed   By: Sharlet Salina M.D.   On: 11/16/2019 16:21    EKG: Independently reviewed. A fib  Assessment/Plan Principal Problem:   Nausea and vomiting Active Problems:   Hypothyroidism   HTN (hypertension)   CAD (coronary artery disease)   Dementia without behavioral disturbance (HCC)   Vomiting   # Vomiting - given report of vomiting with ingestion and overall well appearance, and report from son of what sounds like history of needing esophageal  dilatation, suspect esophageal stricture or other obstructive process may be present. No report of vomiting since arrival to ED. Given advanced age is at risk for baseline swallowing dysfunction. Labs and imaging and exam not suggestive of other intraabdominal process or infection. CT does show constipation. UA shows bacteriuria but not convinced she has UTI. Given hospice care unclear to what extent further evaluation/intervention pt/family would desire. CT does show findings consistent with gastritis. O2 wnl, cxr clear, breathing unlabored; do not think has an aspiration pneumonitis but will monitor closely for signs of that. Mildly elevated LA likely 2/2 dehydration. - NPO for now - IV PPI - SLP swallow eval ordered, consider GI consult -  did not order covid test as covid positive <90 days ago (09/05/2019). Currently asymptomatic.  - s/p 1 L in ED, will order IVF @ 75; repeat lactate in AM  # Bacteriuria - UA with nitrites, bacteria, hgb. No overt signs/symptoms of uti, though initial temp mildly elevated to 99s. Likely asymptomatic bacteriuria. Has received 1 g ceftriaxone in ED - hold on further abx for now - f/u urine culture - monitor for signs/symptoms UTI  # atrial fibrillation - noted on this admission and on prior EKGs, though don't see this listed in history. Rate controlled, is on asa and metop - holding those meds while npo but reasonable to resume when tolerating po. Given hospice care and advanced age likely no need for therapeutic anticoagulation.  # hypothyroid - holding levothyroxine for now  # HTN - here bp wnl - holding metop for now  # insomnia, anxiety - home trazodone, citalopram, melatonin while npo  # stage 2 sacral decub - nursing wound care ordered    DVT prophylaxis: lovenox Code Status: DNR, has order  Family Communication: son, spoke w/ him over the telephone ([place])  Disposition Plan: tbd  Consults called: none  Admission status: med/surg obs    Silvano BilisNoah B Kajuana Shareef MD [facility] Pager [phone]  If 7PM-7AM, please contact night-coverage [email or web address] Password [email or web address]  11/16/2019, 7:51 PM

## 2019-11-17 ENCOUNTER — Encounter: Payer: Self-pay | Admitting: Obstetrics and Gynecology

## 2019-11-17 DIAGNOSIS — I482 Chronic atrial fibrillation, unspecified: Secondary | ICD-10-CM | POA: Diagnosis not present

## 2019-11-17 DIAGNOSIS — R112 Nausea with vomiting, unspecified: Secondary | ICD-10-CM | POA: Diagnosis not present

## 2019-11-17 LAB — BASIC METABOLIC PANEL
Anion gap: 5 (ref 5–15)
BUN: 17 mg/dL (ref 8–23)
CO2: 27 mmol/L (ref 22–32)
Calcium: 8.3 mg/dL — ABNORMAL LOW (ref 8.9–10.3)
Chloride: 107 mmol/L (ref 98–111)
Creatinine, Ser: 0.7 mg/dL (ref 0.44–1.00)
GFR calc Af Amer: >60 mL/min (ref >60)
GFR calc non Af Amer: >60 mL/min (ref >60)
Glucose, Bld: 126 mg/dL — ABNORMAL HIGH (ref 70–99)
Potassium: 4.1 mmol/L (ref 3.5–5.1)
Sodium: 139 mmol/L (ref 135–145)

## 2019-11-17 LAB — CBC
HCT: 31.3 % — ABNORMAL LOW (ref 36.0–46.0)
Hemoglobin: 10.3 g/dL — ABNORMAL LOW (ref 12.0–15.0)
MCH: 36.5 pg — ABNORMAL HIGH (ref 26.0–34.0)
MCHC: 32.9 g/dL (ref 30.0–36.0)
MCV: 111 fL — ABNORMAL HIGH (ref 80.0–100.0)
Platelets: 181 10*3/uL (ref 150–400)
RBC: 2.82 MIL/uL — ABNORMAL LOW (ref 3.87–5.11)
RDW: 12.9 % (ref 11.5–15.5)
WBC: 7.3 10*3/uL (ref 4.0–10.5)
nRBC: 0 % (ref 0.0–0.2)

## 2019-11-17 LAB — LACTIC ACID, PLASMA: Lactic Acid, Venous: 2.2 mmol/L (ref 0.5–1.9)

## 2019-11-17 MED ORDER — ENOXAPARIN SODIUM 40 MG/0.4ML ~~LOC~~ SOLN: 30.0000 mg | SUBCUTANEOUS | Status: DC

## 2019-11-17 MED ORDER — ENOXAPARIN SODIUM 30 MG/0.3ML ~~LOC~~ SOLN
30.0000 mg | SUBCUTANEOUS | Status: DC
  Administered 2019-11-17: 30 mg via SUBCUTANEOUS
  Filled 2019-11-17: qty 0.3

## 2019-11-17 MED ORDER — ORAL CARE MOUTH RINSE
15.0000 mL | Freq: Two times a day (BID) | OROMUCOSAL | Status: DC
  Administered 2019-11-17: 15 mL via OROMUCOSAL

## 2019-11-17 MED ORDER — CHLORHEXIDINE GLUCONATE 0.12 % MT SOLN
15.0000 mL | Freq: Two times a day (BID) | OROMUCOSAL | Status: DC
  Administered 2019-11-17: 15 mL via OROMUCOSAL
  Filled 2019-11-17: qty 15

## 2019-11-17 NOTE — Progress Notes (Signed)
11/17/2019 7:04 PM  Gloria Martinez to be D/C'd assisted living per MD order.  Discussed prescriptions and follow up appointments with the patient. Prescriptions given to patient, medication list explained in detail. Pt verbalized understanding.  Allergies as of 11/17/2019      Reactions   Ciprofloxacin    Macrobid [nitrofurantoin Monohyd Macro]    Penicillins    Percocet [oxycodone-acetaminophen]    Sulfa Antibiotics    Tussionex Pennkinetic Er [hydrocod Polst-cpm Polst Er]    Vicodin [hydrocodone-acetaminophen]       Medication List    TAKE these medications   acetaminophen 500 MG tablet Commonly known as: TYLENOL Take 1,000 mg by mouth in the morning, at noon, and at bedtime. Notes to patient: Before bed 11/17/19   acetaminophen 325 MG tablet Commonly known as: TYLENOL Take 650 mg by mouth every 6 (six) hours as needed for mild pain or fever. Notes to patient: As needed   alum & mag hydroxide-simeth 200-200-20 MG/5ML suspension Commonly known as: MAALOX/MYLANTA Take 30 mLs by mouth every 4 (four) hours as needed for indigestion or heartburn. Notes to patient: As needed   ascorbic acid 500 MG tablet Commonly known as: VITAMIN C Take 500 mg by mouth 2 (two) times daily. Notes to patient: Before bed 11/17/19   aspirin 81 MG chewable tablet Chew 81 mg by mouth daily. Notes to patient: Morning 11/18/19   brimonidine 0.2 % ophthalmic solution Commonly known as: ALPHAGAN Place 1 drop into both eyes in the morning and at bedtime. Notes to patient: Before bed 11/17/19   [employer] Senior Tabs Take 1 tablet by mouth daily. Notes to patient: Morning 11/18/19   cholecalciferol 25 MCG (1000 UNIT) tablet Commonly known as: VITAMIN D3 Take 2,000 Units by mouth daily. Notes to patient: Morning 11/18/19   citalopram 10 MG tablet Commonly known as: CELEXA Take 10 mg by mouth daily. Notes to patient: Morning 11/18/19   diphenhydrAMINE 25 mg capsule Commonly known as: BENADRYL Take 25 mg by  mouth every 6 (six) hours as needed for itching or allergies. Notes to patient: As needed   guaifenesin 100 MG/5ML syrup Commonly known as: ROBITUSSIN Take 200 mg by mouth every 4 (four) hours as needed for cough. Notes to patient: As needed   latanoprost 0.005 % ophthalmic solution Commonly known as: XALATAN Place 1 drop into the right eye at bedtime. Notes to patient: Before bed 11/17/19   levothyroxine 100 MCG tablet Commonly known as: SYNTHROID Take 100 mcg by mouth daily before breakfast. Notes to patient: Morning 11/18/19   loperamide 2 MG capsule Commonly known as: IMODIUM Take 2 mg by mouth 4 (four) times daily as needed for diarrhea or loose stools. Notes to patient: As needed   magnesium hydroxide 400 MG/5ML suspension Commonly known as: MILK OF MAGNESIA Take 30 mLs by mouth daily as needed for mild constipation. Notes to patient: As needed   melatonin 3 MG Tabs tablet Take 3 mg by mouth at bedtime. Notes to patient: Before bed 11/17/19   menthol-zinc oxide powder Apply 1 Dose topically daily as needed (irritation). Notes to patient: As needed   metoprolol succinate 100 MG 24 hr tablet Commonly known as: TOPROL-XL Take 100 mg by mouth daily. Take with or immediately following a meal. Notes to patient: Morning 11/18/19   neomycin-bacitracin-polymyxin ointment Commonly known as: NEOSPORIN Apply 1 application topically daily as needed for wound care. Notes to patient: As needed   polyethylene glycol 17 g packet Commonly known as: MIRALAX /  GLYCOLAX Take 17 g by mouth every other day. Notes to patient: Morning 11/18/19   pyrithione zinc 1 % shampoo Commonly known as: HEAD AND SHOULDERS Apply 1 application topically daily as needed for itching. (apply to scalp) Notes to patient: As needed   Refresh Tears 0.5 % Soln Generic drug: carboxymethylcellulose Place 1 drop into both eyes in the morning and at bedtime. Notes to patient: Before bed 11/17/19    sennosides-docusate sodium 8.6-50 MG tablet Commonly known as: SENOKOT-S Take 1 tablet by mouth in the morning and at bedtime. Notes to patient: before bed 11/17/19   traZODone 50 MG tablet Commonly known as: DESYREL Take 50 mg by mouth at bedtime. Notes to patient: Before bed 11/17/19   zinc sulfate 220 (50 Zn) MG capsule Take 220 mg by mouth daily. Notes to patient: Morning 11/18/19       Vitals:   11/17/19 1218 11/17/19 1819  BP: (!) 101/55 (!) 109/59  Pulse: (!) 50 (!) 54  Resp: 14 14  Temp: (!) 97.4 F (36.3 C) (!) 97.5 F (36.4 C)  SpO2: 98% 100%    Skin clean, dry and intact without evidence of skin break down, no evidence of skin tears noted. IV catheter discontinued intact. Site without signs and symptoms of complications. Dressing and pressure applied. Pt denies pain at this time. No complaints noted.  An After Visit Summary was printed and given to the patient. Patient escorted and D/C to the [facility] via EMS  Dola Argyle

## 2019-11-17 NOTE — Discharge Summary (Signed)
Physician Discharge Summary  Gloria Martinez:[id number] DOB: 31-Jan-1920 DOA: 11/16/2019  PCP: Gloria Ruths, MD  Admit date: 11/16/2019 Discharge date: 11/17/2019  Discharge disposition: Assisted living facility   Recommendations for Outpatient Follow-Up:   Follow-up with hospice team at the assisted living facility   Discharge Diagnosis:   Principal Problem:   Nausea and vomiting Active Problems:   Hypothyroidism   HTN (hypertension)   CAD (coronary artery disease)   Dementia without behavioral disturbance (HCC)   Vomiting   Atrial fibrillation, chronic ([place])    Discharge Condition: Stable.  Diet recommendation: Dysphagia 2 diet  Code status: DNR.    Hospital Course:   Ms.Gloria Martinez is a 86 y.o. female with medical history significant for hospice care, history hip fracture, hypothyroidism, hypertension, CAD, covid January of this year, TIA, dementia, dysphagia that required having her "throat stretched".  She presented to the hospital with choking/vomiting.  She had abnormal urinalysis but clinical features were not convincing for UTI.  She received IV fluids and a dose of IV Rocephin in the emergency room.  She was seen by speech therapist who recommended dysphagia 2 diet.  On ward rounds today, patient said she felt okay.  She has been able to tolerate her diet without any problems.  She is deemed stable for discharge back to the assisted living facility.  I called her son, Gloria Martinez, on 2 occasions to give him an update but there was no response.      Discharge Exam:   Vitals:   11/17/19 1211 11/17/19 1218  BP: (!) 100/53 (!) 101/55  Pulse: (!) 51 (!) 50  Resp: 14 14  Temp: (!) 97.4 F (36.3 C) (!) 97.4 F (36.3 C)  SpO2: 100% 98%   Vitals:   11/16/19 2230 11/17/19 0550 11/17/19 1211 11/17/19 1218  BP: (!) 166/75 (!) 138/48 (!) 100/53 (!) 101/55  Pulse: (!) 59 (!) 53 (!) 51 (!) 50  Resp: 18 20 14 14   Temp: 97.6 F (36.4 C) (!)  97.4 F (36.3 C) (!) 97.4 F (36.3 C) (!) 97.4 F (36.3 C)  TempSrc: Oral Oral Oral Oral  SpO2: 100% 100% 100% 98%  Weight:      Height:         GEN: NAD, cachectic SKIN: No rash EYES: EOMI ENT: MMM CV: RRR PULM: CTA B ABD: soft, ND, NT, +BS CNS: AAO x1 (person), non focal EXT: No edema or tenderness   The results of significant diagnostics from this hospitalization (including imaging, microbiology, ancillary and laboratory) are listed below for reference.     Procedures and Diagnostic Studies:   CT Head Wo Contrast  Result Date: 11/16/2019 CLINICAL DATA:  Altered mental status. EXAM: CT HEAD WITHOUT CONTRAST TECHNIQUE: Contiguous axial images were obtained from the base of the skull through the vertex without intravenous contrast. COMPARISON:  09/05/2019 FINDINGS: Brain: Stable age related significant cerebral atrophy, ventriculomegaly and periventricular white matter disease. No extra-axial fluid collections are identified. No CT findings for acute hemispheric infarction or intracranial hemorrhage. No mass lesions. The brainstem and cerebellum are normal. Vascular: Stable vascular calcifications with a dolichoectatic left vertebral artery. No hyperdense vessels. Skull: No skull fracture or bone lesions. Stable hyperostosis frontalis interna. Sinuses/Orbits: The paranasal sinuses and mastoid air cells are clear. The globes are intact. Other: No scalp lesions or hematoma. IMPRESSION: 1. Stable age related cerebral atrophy, ventriculomegaly and periventricular white matter disease. 2. No acute intracranial findings or mass lesions. Electronically Signed  By: Gloria Martinez M.D.   On: 11/16/2019 18:33   CT ABDOMEN PELVIS W CONTRAST  Result Date: 11/16/2019 CLINICAL DATA:  86 year old with nausea and vomiting. EXAM: CT ABDOMEN AND PELVIS WITH CONTRAST TECHNIQUE: Multidetector CT imaging of the abdomen and pelvis was performed using the standard protocol following bolus administration of  intravenous contrast. CONTRAST:  71mL OMNIPAQUE IOHEXOL 300 MG/ML  SOLN COMPARISON:  CT 01/08/2013 FINDINGS: Lower chest: Mild cardiomegaly with coronary artery calcifications. Trace left pleural effusion with adjacent atelectasis. Mild dependent atelectasis at the right lung base. Hepatobiliary: Hepatic steatosis without evidence of focal hepatic lesion. Small gallstone within the gallbladder. No pericholecystic inflammation. Prominent common bile duct at 8 mm, normal for age. Pancreas: Parenchymal atrophy. No ductal dilatation or inflammation. Lobulated cystic lesion arising from the pancreatic tail measures 2.7 x 3.7 cm, previously 1.4 x 0.9 cm. No surrounding inflammation. Spleen: Normal in size. Small cleft posteriorly. Adrenals/Urinary Tract: No adrenal nodule. Mild bilateral renal parenchymal thinning. No hydronephrosis. 18 mm cyst in the upper left kidney. Additional tiny cortical hypodensities in the right and left kidney are too small to characterize. No hydronephrosis. No perinephric edema. Homogeneous renal enhancement with symmetric excretion on delayed phase imaging. Urinary bladder is partially distended. Questionable bladder wall enhancement with mild wall thickening. Stomach/Bowel: Mild wall thickening at the gastroesophageal junction. Fluid-filled stomach with equivocal gastric wall enhancement. No small bowel obstruction or significant inflammation. Appendix tentatively visualized and normal, regardless, no appendicitis. Cecum is high-riding in the right mid abdomen. Moderate volume of stool throughout the colon. Transverse colon is tortuous. Stool distends the rectum with rectal dimension of 7.9 cm. No definite rectal wall thickening or adjacent inflammation, detailed evaluation partially obscured by streak artifact from right hip arthroplasty. Vascular/Lymphatic: Infrarenal aortic aneurysm, increased in size from prior exam, current maximal dimension 3.6 cm. Calcified plaque in the central mural  thrombus. No periaortic stranding. Portal vein is patent. No suspicious adenopathy. Reproductive: Status post hysterectomy. No adnexal masses. Other: No ascites or free air. Musculoskeletal: Moderate to severe T9 compression fracture. Moderate T12 compression fracture. Minor L1 and L4 compression fractures. These are age indeterminate. Bones are diffusely under mineralized. Remote posterior right rib fractures. Right hip arthroplasty. IMPRESSION: 1. Mild wall thickening at the gastroesophageal junction and equivocal gastric wall enhancement, can be seen with gastritis or peptic ulcer disease. 2. Moderate stool burden throughout the colon with stool distending the rectum, suggesting constipation. No bowel obstruction. 3. Lobulated cystic lesion arising from the pancreatic tail, current maximal dimension 3.6 cm. This is increased in size from 2014 MRI. Consensus guidelines are as follows, in giving consideration for patient's advanced age: Recommend follow up pre and post contrast MRI/MRCP or pancreatic protocol CT in 2 years. This recommendation follows ACR consensus guidelines: Management of Incidental Pancreatic Cysts: A White Paper of the ACR Incidental Findings Committee. J Am Coll Radiol 2017;14:911-923. 4. Cholelithiasis without gallbladder inflammation. 5. Infrarenal aortic aneurysm, maximal dimension 3.6 cm. Recommend followup by ultrasound in 3 years, giving consideration for patient's advanced age. This recommendation follows ACR consensus guidelines: White Paper of the ACR Incidental Findings Committee II on Vascular Findings. J Am Coll Radiol 2013; 10: 6. Osteopenia with multilevel compression fractures in the lower thoracic and lumbar spine, age indeterminate. Electronically Signed   By: Narda Rutherford M.D.   On: 11/16/2019 18:43   DG Chest Portable 1 View  Result Date: 11/16/2019 CLINICAL DATA:  Cough, vomiting, lethargy EXAM: PORTABLE CHEST 1 VIEW COMPARISON:  03/24/2019 FINDINGS: Single frontal  view of the chest demonstrates a stable cardiac silhouette. Prominent atherosclerosis of the aortic arch. Focal increased density over the right posterior fifth rib likely reflects confluence of shadows from the first anterior costochondral junction and underlying vessels, stable. No acute airspace disease, effusion, or pneumothorax. Chronic left rib fractures are seen. No acute bony abnormalities. IMPRESSION: 1. Stable chest, no acute process. Electronically Signed   By: Sharlet Salina M.D.   On: 11/16/2019 16:21     Labs:   Basic Metabolic Panel: Recent Labs  Lab 11/16/19 1534 11/17/19 0426  NA 139 139  K 3.6 4.1  CL 105 107  CO2 27 27  GLUCOSE 163* 126*  BUN 22 17  CREATININE 0.95 0.70  CALCIUM 8.7* 8.3*   GFR Estimated Creatinine Clearance: 30.9 mL/min (by C-G formula based on SCr of 0.7 mg/dL). Liver Function Tests: Recent Labs  Lab 11/16/19 1534  AST 20  ALT 9  ALKPHOS 53  BILITOT 0.6  PROT 6.5  ALBUMIN 3.4*   Recent Labs  Lab 11/16/19 1534  LIPASE 46   No results for input(s): AMMONIA in the last 168 hours. Coagulation profile No results for input(s): INR, PROTIME in the last 168 hours.  CBC: Recent Labs  Lab 11/16/19 1534 11/17/19 0426  WBC 6.9 7.3  NEUTROABS 4.5  --   HGB 11.5* 10.3*  HCT 36.1 31.3*  MCV 115.3* 111.0*  PLT 222 181   Cardiac Enzymes: No results for input(s): CKTOTAL, CKMB, CKMBINDEX, TROPONINI in the last 168 hours. BNP: Invalid input(s): POCBNP CBG: No results for input(s): GLUCAP in the last 168 hours. D-Dimer No results for input(s): DDIMER in the last 72 hours. Hgb A1c No results for input(s): HGBA1C in the last 72 hours. Lipid Profile No results for input(s): CHOL, HDL, LDLCALC, TRIG, CHOLHDL, LDLDIRECT in the last 72 hours. Thyroid function studies No results for input(s): TSH, T4TOTAL, T3FREE, THYROIDAB in the last 72 hours.  Invalid input(s): FREET3 Anemia work up No results for input(s): VITAMINB12, FOLATE,  FERRITIN, TIBC, IRON, RETICCTPCT in the last 72 hours. Microbiology No results found for this or any previous visit (from the past 240 hour(s)).   Discharge Instructions:   Discharge Instructions    DIET DYS 2   Complete by: As directed    Fluid consistency: Thin   Diet - low sodium heart healthy   Complete by: As directed      Allergies as of 11/17/2019      Reactions   Ciprofloxacin    Macrobid [nitrofurantoin Monohyd Macro]    Penicillins    Percocet [oxycodone-acetaminophen]    Sulfa Antibiotics    Tussionex Pennkinetic Er [hydrocod Polst-cpm Polst Er]    Vicodin [hydrocodone-acetaminophen]       Medication List    TAKE these medications   acetaminophen 500 MG tablet Commonly known as: TYLENOL Take 1,000 mg by mouth in the morning, at noon, and at bedtime.   acetaminophen 325 MG tablet Commonly known as: TYLENOL Take 650 mg by mouth every 6 (six) hours as needed for mild pain or fever.   alum & mag hydroxide-simeth 200-200-20 MG/5ML suspension Commonly known as: MAALOX/MYLANTA Take 30 mLs by mouth every 4 (four) hours as needed for indigestion or heartburn.   ascorbic acid 500 MG tablet Commonly known as: VITAMIN C Take 500 mg by mouth 2 (two) times daily.   aspirin 81 MG chewable tablet Chew 81 mg by mouth daily.   brimonidine 0.2 % ophthalmic solution Commonly known as: ALPHAGAN Place  1 drop into both eyes in the morning and at bedtime.   Cerovite Senior Tabs Take 1 tablet by mouth daily.   cholecalciferol 25 MCG (1000 UNIT) tablet Commonly known as: VITAMIN D3 Take 2,000 Units by mouth daily.   citalopram 10 MG tablet Commonly known as: CELEXA Take 10 mg by mouth daily.   diphenhydrAMINE 25 mg capsule Commonly known as: BENADRYL Take 25 mg by mouth every 6 (six) hours as needed for itching or allergies.   guaifenesin 100 MG/5ML syrup Commonly known as: ROBITUSSIN Take 200 mg by mouth every 4 (four) hours as needed for cough.   latanoprost  0.005 % ophthalmic solution Commonly known as: XALATAN Place 1 drop into the right eye at bedtime.   levothyroxine 100 MCG tablet Commonly known as: SYNTHROID Take 100 mcg by mouth daily before breakfast.   loperamide 2 MG capsule Commonly known as: IMODIUM Take 2 mg by mouth 4 (four) times daily as needed for diarrhea or loose stools.   magnesium hydroxide 400 MG/5ML suspension Commonly known as: MILK OF MAGNESIA Take 30 mLs by mouth daily as needed for mild constipation.   melatonin 3 MG Tabs tablet Take 3 mg by mouth at bedtime.   menthol-zinc oxide powder Apply 1 Dose topically daily as needed (irritation).   metoprolol succinate 100 MG 24 hr tablet Commonly known as: TOPROL-XL Take 100 mg by mouth daily. Take with or immediately following a meal.   neomycin-bacitracin-polymyxin ointment Commonly known as: NEOSPORIN Apply 1 application topically daily as needed for wound care.   polyethylene glycol 17 g packet Commonly known as: MIRALAX / GLYCOLAX Take 17 g by mouth every other day.   pyrithione zinc 1 % shampoo Commonly known as: HEAD AND SHOULDERS Apply 1 application topically daily as needed for itching. (apply to scalp)   Refresh Tears 0.5 % Soln Generic drug: carboxymethylcellulose Place 1 drop into both eyes in the morning and at bedtime.   sennosides-docusate sodium 8.6-50 MG tablet Commonly known as: SENOKOT-S Take 1 tablet by mouth in the morning and at bedtime.   traZODone 50 MG tablet Commonly known as: DESYREL Take 50 mg by mouth at bedtime.   zinc sulfate 220 (50 Zn) MG capsule Take 220 mg by mouth daily.         Time coordinating discharge: 32 minutes  Signed:  Juanda Luba  [facility] 11/17/2019, 3:45 PM

## 2019-11-17 NOTE — Progress Notes (Signed)
Patient to return to the [facility] [address]. Discharge summary faxed to the [facility]. EMS called for transport.    Chrystal [place], [place] Clinical Social Work [phone]

## 2019-11-17 NOTE — Progress Notes (Signed)
Message received from the RN that patient may discharge today. Patient is from [facility] and is under Hospice Care. Phone call to [facility] to discuss patient's return.  This Child psychotherapist spoke with Amalia Hailey who requested that the discharge summary be faxed before patient's return. Fax number is [phone]. RN and attending MD notified of need for discharge summary before patient's return to the facility.   Sady Monaco, LCSW Clinical Social Work [phone] .

## 2019-11-17 NOTE — Evaluation (Signed)
Clinical/Bedside Swallow Evaluation Patient Details  Name: Gloria Martinez MRN: [id number] Date of Birth: Apr 04, 1920  Today's Date: 11/17/2019 Time: SLP Start Time (ACUTE ONLY): 1045 SLP Stop Time (ACUTE ONLY): 1145 SLP Time Calculation (min) (ACUTE ONLY): 60 min  Past Medical History:  Past Medical History:  Diagnosis Date  . Anxiety   . CHF (congestive heart failure) (HCC)   . Coronary artery disease   . Dementia (HCC)   . Diverticulitis   . Dysphagia   . Epilepsy (HCC)   . Glaucoma   . Hyperlipidemia   . Hypertension   . Hypothyroidism   . Muscle weakness    Past Surgical History:  Past Surgical History:  Procedure Laterality Date  . ABDOMINAL HYSTERECTOMY    . Hip Replacement  Right 2009   HPI:  Pt is a 86 y.o. female with medical history significant for Hospice care, history hip fracture, Cognitive decline per chart notes, hypothyroidism, htn, CAD, covid January of this year, TIA, dementia, who presented to ED w/ vomiting and choking. Son says the patient has a history of needing to have her "throat stretched" several times over the years for difficulty swallowing foods, unsure where. No report of hematemesis. She is also reportedly constipated, unclear when last BM.    Assessment / Plan / Recommendation Clinical Impression  Pt appears to present w/ grossly adequate oropharyngeal phase swallowing at this evaluation today in the setting of declined medical and Cognitive status' baseline(Dementia baseline per chart notes prior). Pt also has the h/o needing to have her "throat stretched" several times over the years for difficulty swallowing foods, per Son. ANY Esophageal dysmotility could increase risk for Regurgitation and aspiration of Reflux material thus pulmonary decline. Pt is currently w/ Hospice Services per chart notes.  During po trials of thin liquids via cup/straw, purees, and softened solids, inconsistent, delayed throat clearing was noted during trials of solids, thin  liquids x1. No overt coughing or decline in respiratory presentation was noted during/post trials. Pt was awake/alert engaging in po tasks w/ SLP; helped to hold Cup to feed self; setup and cues given intermittently to maintain attention w/ tasks. Pt followed instructions w/ cues. Pt needed full positioning support to sit upright for oral intake. Oral phase appeared grossly [facility] for bolus management of liquids and purees and well-broken down soft solids; A-P transfer was timely and oral clearing achieved post swallows. Min+ increased time for full mastication w/ increased texture solids. Again, no decline in respiratory status noted as po trials were consumed during the evaluation. OM exam appeared [facility] w/ no unilateral weakness noted. Recommend a more Minced foods diet (w/ gravy to moisten for ease of chewing and clearing of the Esophagus) w/ Thin liquids. Recommend aspiration precautions; pills in a Puree for safer swallowing than w/ liquids. Tray setup at meals; assist and monitor at meals d/t weakness and Cognitive decline/status. ST services will continue to f/u w/ toleration of diet while admitted for any need to modify the diet consistency. Recommend a more broken-down solid foods diet for ease of clearing the Esophagus d/t pt's h/o Esophageal dysmotility and h/o Dilations. Spoke w/ Son who agreed w/ POC for pt.  SLP Visit Diagnosis: Dysphagia, unspecified (R13.10)(Esophageal dysmotility; Cognitive decline )    Aspiration Risk  Mild aspiration risk;Risk for inadequate nutrition/hydration    Diet Recommendation     Medication Administration: Whole meds with puree(vs need to Crush)    Other  Recommendations Recommended Consults: Consider GI evaluation(Dietician f/u) Oral Care Recommendations:  Oral care BID;Oral care before and after PO;Staff/trained caregiver to provide oral care Other Recommendations: (n/a)   Follow up Recommendations None      Frequency and Duration min 1 x/week  1 week        Prognosis Prognosis for Safe Diet Advancement: Fair Barriers to Reach Goals: Cognitive deficits;Time post onset;Severity of deficits(Esophageal dysmotility)      Swallow Study   General Date of Onset: 11/16/19 HPI: Pt is a 86 y.o. female with medical history significant for Hospice care, history hip fracture, Cognitive decline per chart notes, hypothyroidism, htn, CAD, covid January of this year, TIA, dementia, who presented to ED w/ vomiting and choking. Son says the patient has a history of needing to have her "throat stretched" several times over the years for difficulty swallowing foods, unsure where. No report of hematemesis. She is also reportedly constipated, unclear when last BM.  Type of Study: Bedside Swallow Evaluation Previous Swallow Assessment: 03/2019 Diet Prior to this Study: Dysphagia 1 (puree);Nectar-thick liquids(at that time) Temperature Spikes Noted: No(wbc 7.3) Respiratory Status: Room air History of Recent Intubation: No Behavior/Cognition: Alert;Cooperative;Pleasant mood;Confused;Distractible;Requires cueing Oral Cavity Assessment: Dry Oral Care Completed by SLP: Yes Oral Cavity - Dentition: Adequate natural dentition Vision: Functional for self-feeding Self-Feeding Abilities: Able to feed self;Needs assist;Needs set up;Total assist Patient Positioning: Upright in bed(needed full positioning support) Baseline Vocal Quality: Normal Volitional Cough: Strong(adequate) Volitional Swallow: Able to elicit    Oral/Motor/Sensory Function Overall Oral Motor/Sensory Function: Within functional limits   Ice Chips Ice chips: Within functional limits Presentation: Spoon(3 trials ) Other Comments: masticated them   Thin Liquid Thin Liquid: Within functional limits(grossly) Presentation: Cup;Self Fed;Straw(5 trials each) Other Comments: delayed throat clearing x1 much past trials     Nectar Thick Nectar Thick Liquid: Not tested   Honey Thick Honey Thick Liquid: Not  tested   Puree Puree: Within functional limits Presentation: Spoon(fed; 5 trials)   Solid     Solid: Impaired(min) Presentation: Spoon(6 trials) Oral Phase Impairments: (min increased time) Oral Phase Functional Implications: (min increased time) Pharyngeal Phase Impairments: Throat Clearing - Delayed(x1 - stated "dryness")       Orinda Kenner, MS, CCC-SLP Kashif Pooler 11/17/2019,12:10 PM

## 2019-11-19 LAB — URINE CULTURE: Culture: 60000 — AB

## 2020-07-16 DEATH — deceased
